# Patient Record
Sex: Female | Born: 1995 | Hispanic: No | Marital: Single | Smoking: Never smoker
Health system: Southern US, Community
[De-identification: ages and names within clinical notes are randomized; demographics above are authoritative.]

## PROBLEM LIST (undated history)

## (undated) DIAGNOSIS — R569 Unspecified convulsions: Principal | ICD-10-CM

---

## 2022-04-26 ENCOUNTER — Emergency Department (HOSPITAL_BASED_OUTPATIENT_CLINIC_OR_DEPARTMENT_OTHER): Payer: Self-pay | Admitting: Radiology

## 2022-04-26 ENCOUNTER — Emergency Department (HOSPITAL_BASED_OUTPATIENT_CLINIC_OR_DEPARTMENT_OTHER): Payer: Self-pay

## 2022-04-26 ENCOUNTER — Other Ambulatory Visit: Payer: Self-pay

## 2022-04-26 ENCOUNTER — Inpatient Hospital Stay (HOSPITAL_BASED_OUTPATIENT_CLINIC_OR_DEPARTMENT_OTHER)
Admission: EM | Admit: 2022-04-26 | Discharge: 2022-04-28 | DRG: 101 | Disposition: A | Discharge status: Home / Self Care | Payer: Self-pay | Attending: Family Medicine | Admitting: Family Medicine

## 2022-04-26 ENCOUNTER — Encounter (HOSPITAL_BASED_OUTPATIENT_CLINIC_OR_DEPARTMENT_OTHER): Payer: Self-pay

## 2022-04-26 DIAGNOSIS — D75839 Thrombocytosis, unspecified: Secondary | ICD-10-CM | POA: Diagnosis present

## 2022-04-26 DIAGNOSIS — Z560 Unemployment, unspecified: Secondary | ICD-10-CM

## 2022-04-26 DIAGNOSIS — R5383 Other fatigue: Secondary | ICD-10-CM | POA: Diagnosis present

## 2022-04-26 DIAGNOSIS — Z609 Problem related to social environment, unspecified: Secondary | ICD-10-CM

## 2022-04-26 DIAGNOSIS — R569 Unspecified convulsions: Principal | ICD-10-CM

## 2022-04-26 DIAGNOSIS — M791 Myalgia, unspecified site: Secondary | ICD-10-CM | POA: Diagnosis present

## 2022-04-26 DIAGNOSIS — R110 Nausea: Secondary | ICD-10-CM | POA: Diagnosis present

## 2022-04-26 DIAGNOSIS — Z79899 Other long term (current) drug therapy: Secondary | ICD-10-CM

## 2022-04-26 HISTORY — DX: Unspecified convulsions: R56.9

## 2022-04-26 LAB — URINALYSIS, ROUTINE W REFLEX MICROSCOPIC
Bilirubin Urine: NEGATIVE
Glucose, UA: NEGATIVE mg/dL
Hgb urine dipstick: NEGATIVE
Ketones, ur: NEGATIVE mg/dL
Leukocytes,Ua: NEGATIVE
Nitrite: NEGATIVE
Protein, ur: NEGATIVE mg/dL
Specific Gravity, Urine: 1.011 (ref 1.005–1.030)
pH: 6.5 (ref 5.0–8.0)

## 2022-04-26 LAB — CBC WITH DIFFERENTIAL/PLATELET
Abs Immature Granulocytes: 0.01 10*3/uL (ref 0.00–0.07)
Basophils Absolute: 0 10*3/uL (ref 0.0–0.1)
Basophils Relative: 0 %
Eosinophils Absolute: 0.2 10*3/uL (ref 0.0–0.5)
Eosinophils Relative: 2 %
HCT: 36.9 % (ref 36.0–46.0)
Hemoglobin: 12 g/dL (ref 12.0–15.0)
Immature Granulocytes: 0 %
Lymphocytes Relative: 34 %
Lymphs Abs: 2.9 10*3/uL (ref 0.7–4.0)
MCH: 27.8 pg (ref 26.0–34.0)
MCHC: 32.5 g/dL (ref 30.0–36.0)
MCV: 85.6 fL (ref 80.0–100.0)
Monocytes Absolute: 0.7 10*3/uL (ref 0.1–1.0)
Monocytes Relative: 8 %
Neutro Abs: 4.7 10*3/uL (ref 1.7–7.7)
Neutrophils Relative %: 56 %
Platelets: 422 10*3/uL — ABNORMAL HIGH (ref 150–400)
RBC: 4.31 MIL/uL (ref 3.87–5.11)
RDW: 13.5 % (ref 11.5–15.5)
WBC: 8.4 10*3/uL (ref 4.0–10.5)
nRBC: 0 % (ref 0.0–0.2)

## 2022-04-26 LAB — CBG MONITORING, ED: Glucose-Capillary: 84 mg/dL (ref 70–99)

## 2022-04-26 LAB — COMPREHENSIVE METABOLIC PANEL WITH GFR
ALT: 14 U/L (ref 0–44)
AST: 18 U/L (ref 15–41)
Albumin: 3.8 g/dL (ref 3.5–5.0)
Alkaline Phosphatase: 58 U/L (ref 38–126)
Anion gap: 7 (ref 5–15)
BUN: 7 mg/dL (ref 6–20)
CO2: 30 mmol/L (ref 22–32)
Calcium: 9.1 mg/dL (ref 8.9–10.3)
Chloride: 104 mmol/L (ref 98–111)
Creatinine, Ser: 0.71 mg/dL (ref 0.44–1.00)
GFR, Estimated: >60 mL/min
Glucose, Bld: 91 mg/dL (ref 70–99)
Potassium: 3.8 mmol/L (ref 3.5–5.1)
Sodium: 141 mmol/L (ref 135–145)
Total Bilirubin: 0.2 mg/dL — ABNORMAL LOW (ref 0.3–1.2)
Total Protein: 6.6 g/dL (ref 6.5–8.1)

## 2022-04-26 LAB — RAPID URINE DRUG SCREEN, HOSP PERFORMED
Amphetamines: NOT DETECTED
Barbiturates: NOT DETECTED
Benzodiazepines: NOT DETECTED
Cocaine: NOT DETECTED
Opiates: NOT DETECTED
Tetrahydrocannabinol: NOT DETECTED

## 2022-04-26 LAB — HCG, SERUM, QUALITATIVE: Preg, Serum: NEGATIVE

## 2022-04-26 IMAGING — CT CT HEAD W/O CM
4 series · 16 of 47 positions shown, 18 images · non-contrast
Comparison: None Available.

CLINICAL DATA: Seizure.



[Series 2: head wo · axial · 0.44mm/px · z∈[-466,-346]mm · 7 of 34 slices shown, 9 images]
[im 5/34  brain]
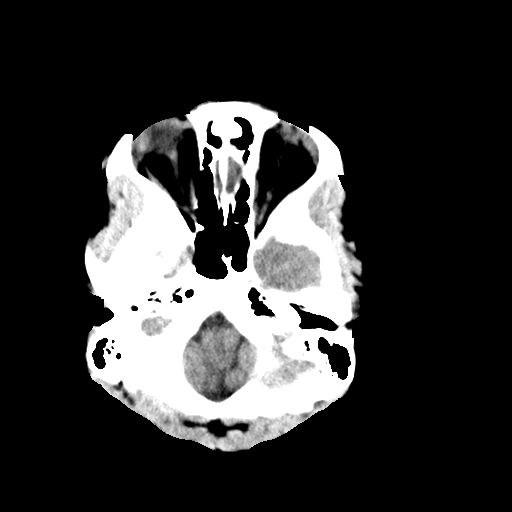
[im 5/34  bone]
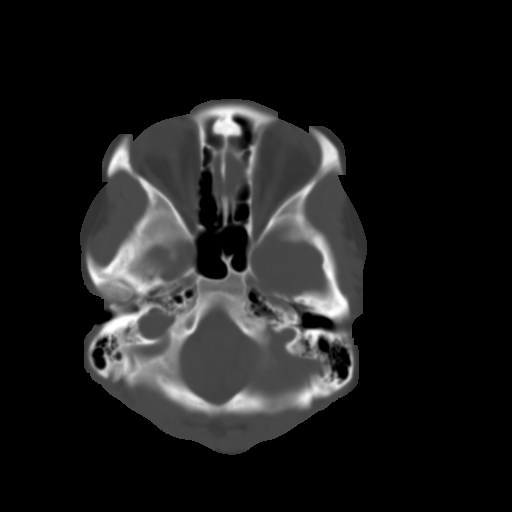
[im 9/34  brain]
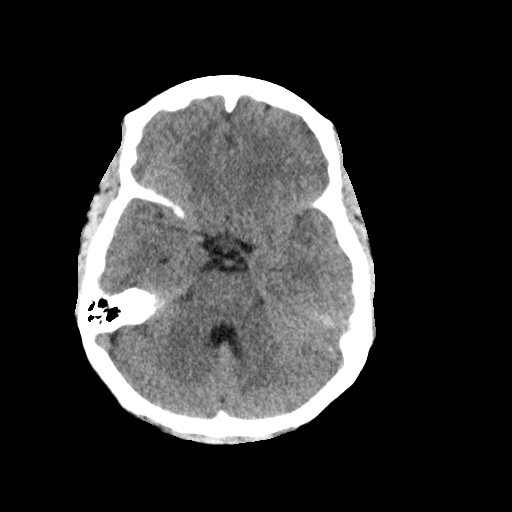
[im 13/34  brain]
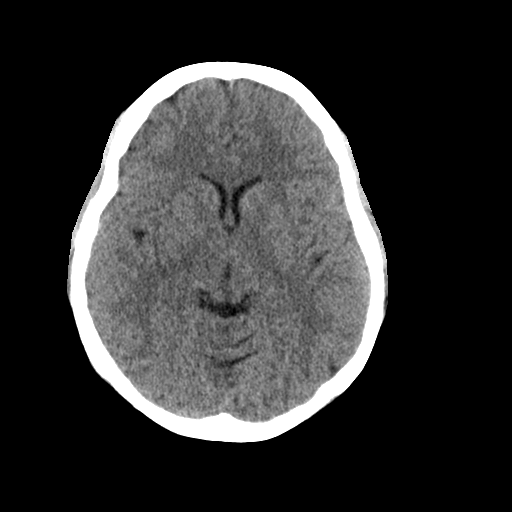
[im 17/34  brain]
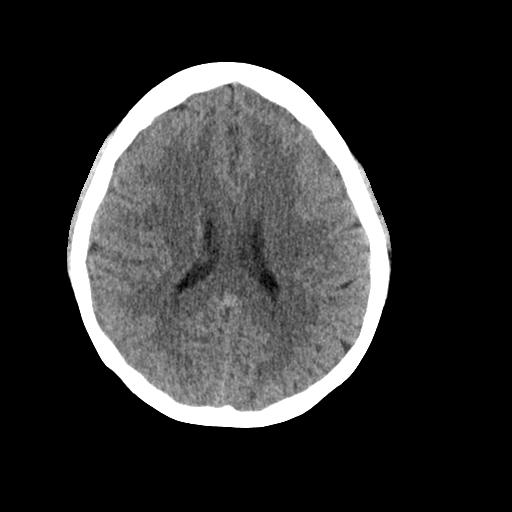
[im 21/34  brain]
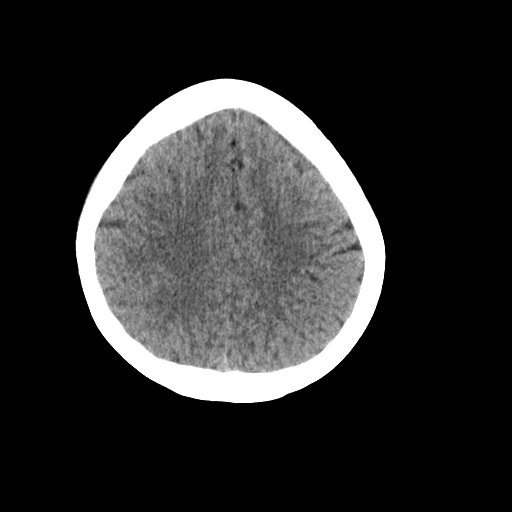
[im 21/34  bone]
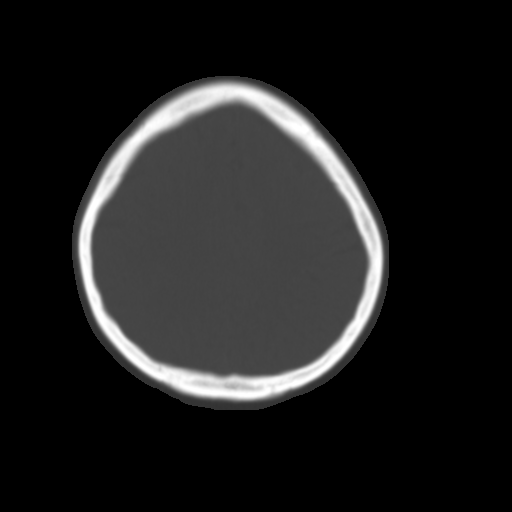
[im 25/34  brain]
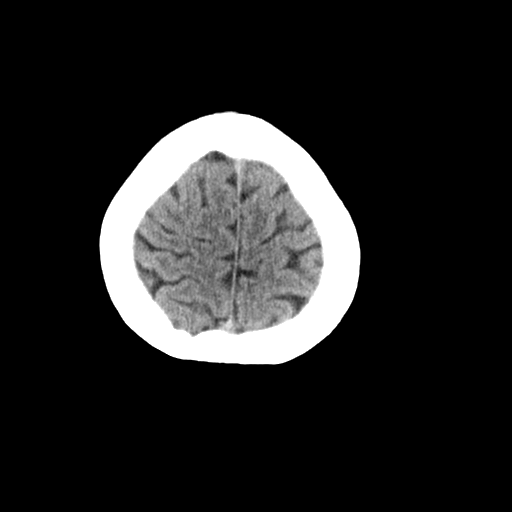
[im 29/34  brain]
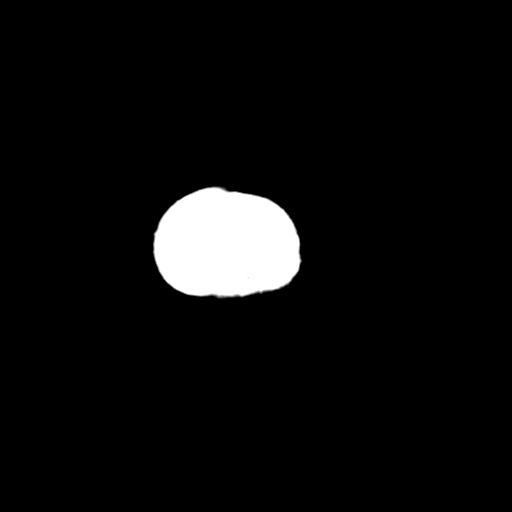

[Series 3: head bone · axial · 0.44mm/px · z∈[-470,-438]mm · 3 of 84 slices shown]
[im 9/84  bone]
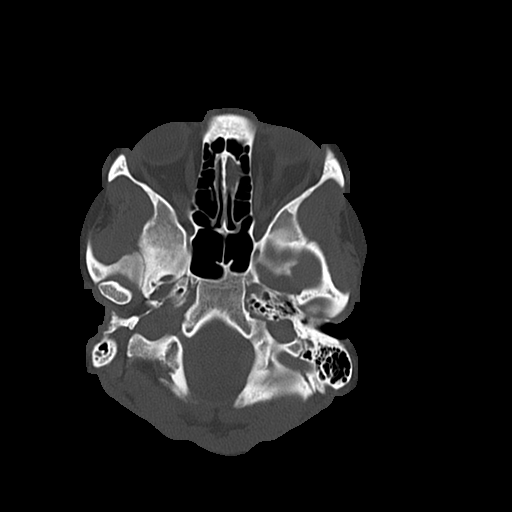
[im 17/84  bone]
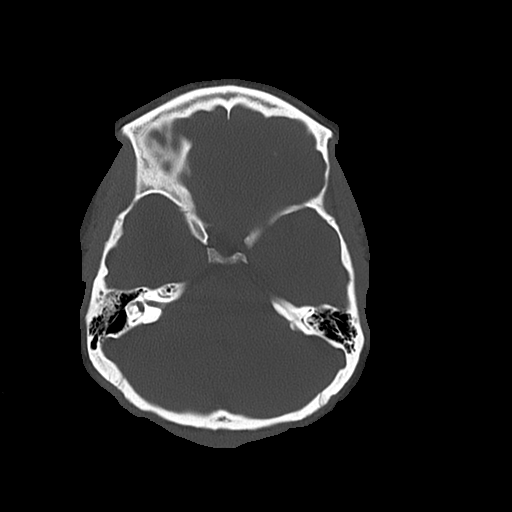
[im 25/84  bone]
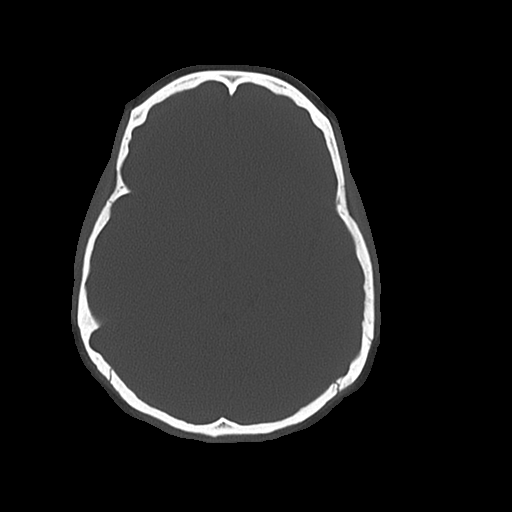

[Series 4: coronal soft · coronal · 0.31mm/px · 3 of 72 slices shown]
[im 24/72  brain]
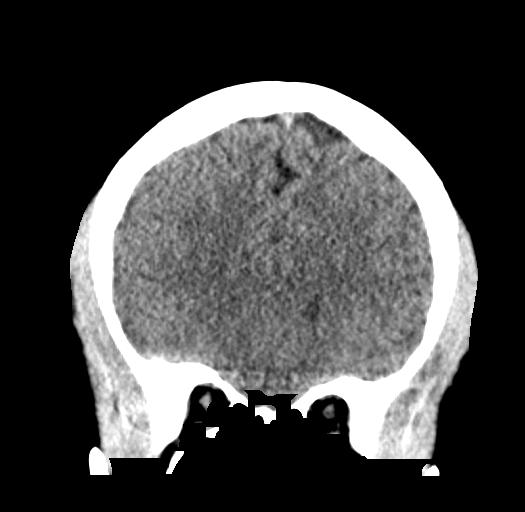
[im 32/72  brain]
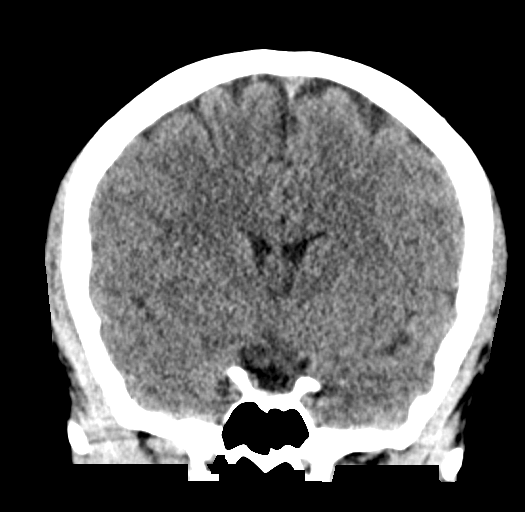
[im 40/72  brain]
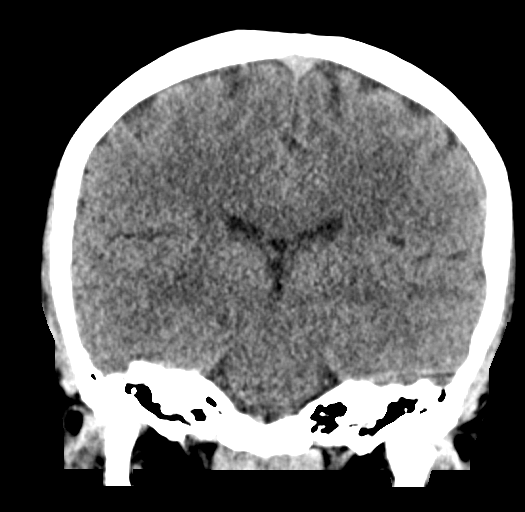

[Series 5: sagittal soft · sagittal · 0.31mm/px · 3 of 54 slices shown]
[im 18/54  brain]
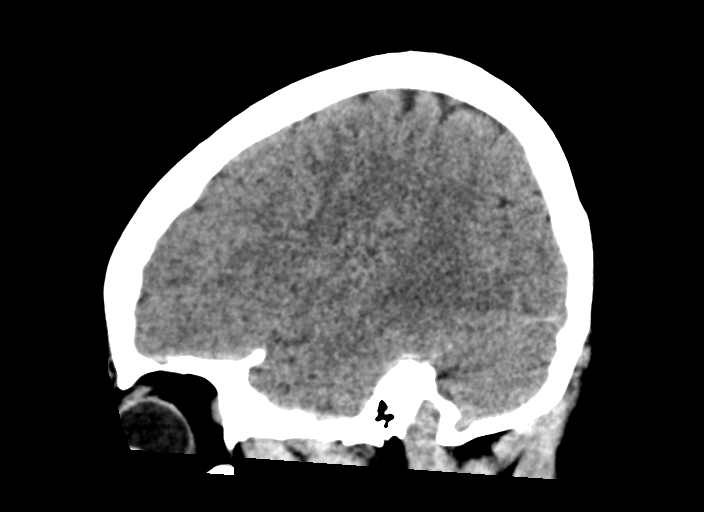
[im 27/54  brain]
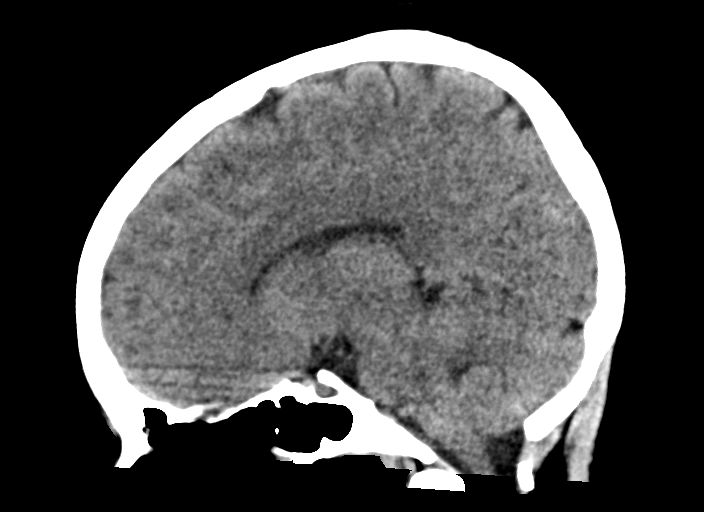
[im 36/54  brain]
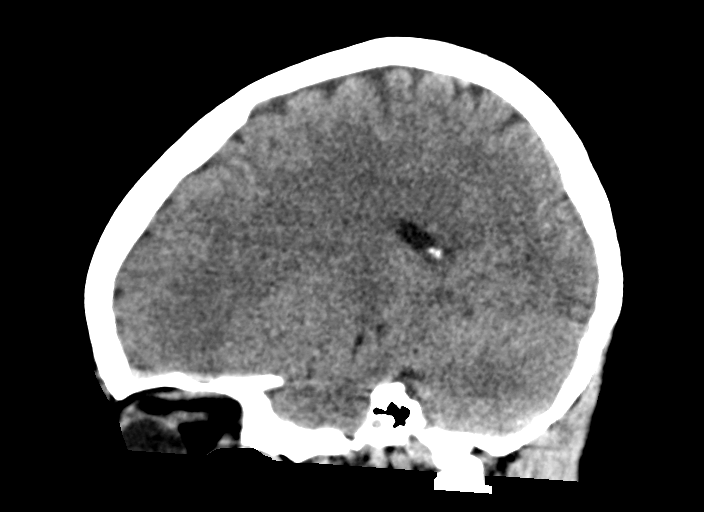

[16 of 47 positions shown; findings below may reference images not displayed]

FINDINGS: Brain: The ventricles and sulci are appropriate size for the
patient's age. The gray-white matter discrimination is preserved.
There is no acute intracranial hemorrhage. No mass effect or midline
shift. No extra-axial fluid collection.

Vascular: No hyperdense vessel or unexpected calcification.

Skull: Normal. Negative for fracture or focal lesion.

Sinuses/Orbits: No acute finding.

Other: None
IMPRESSION: Unremarkable noncontrast CT of the brain.

## 2022-04-26 IMAGING — DX DG CHEST 2V
2 series · 2 of 2 positions shown · non-contrast
Comparison: None Available.

CLINICAL DATA: Multiple seizures.

EXAM:
CHEST - 2 VIEW

[chest lat]
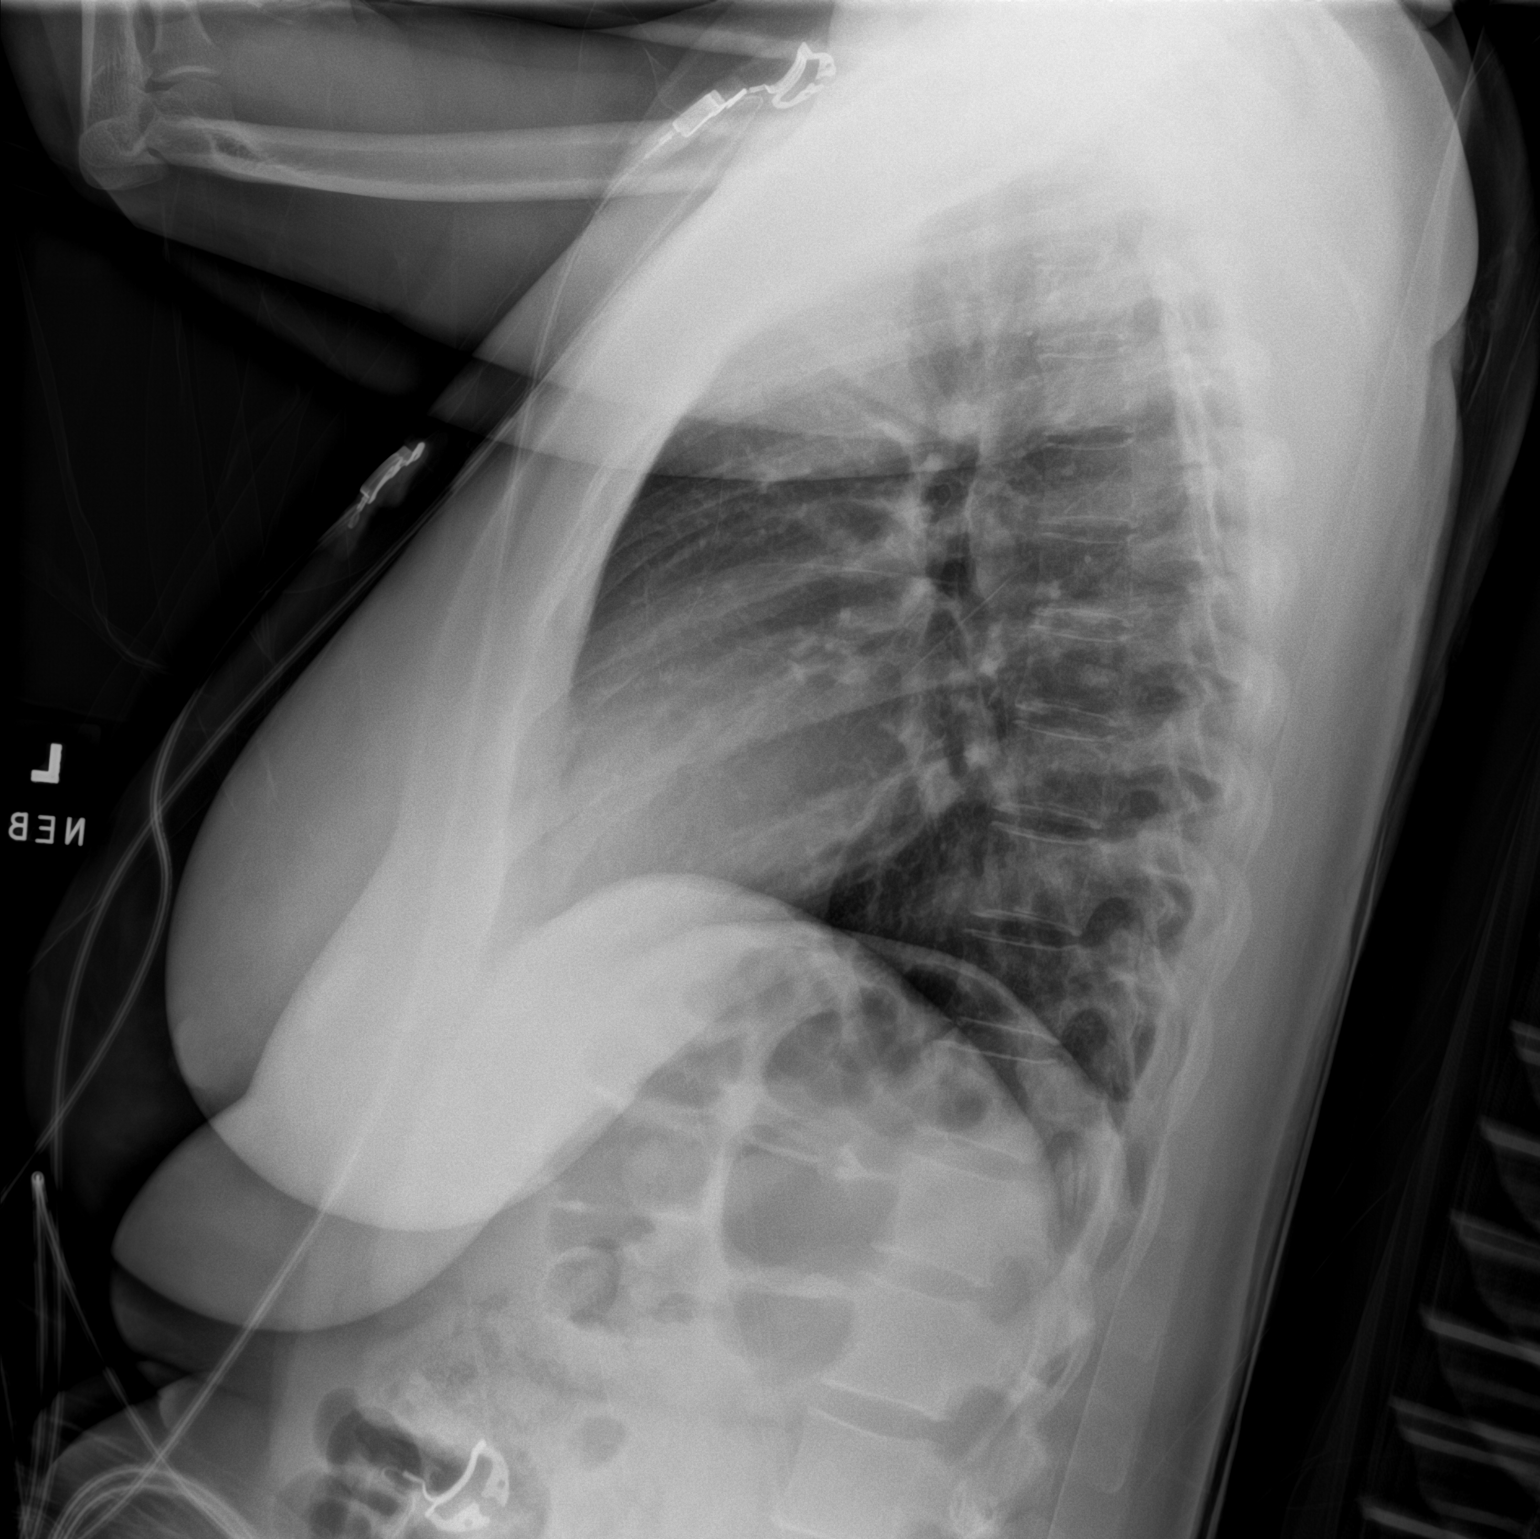

[chest ap]
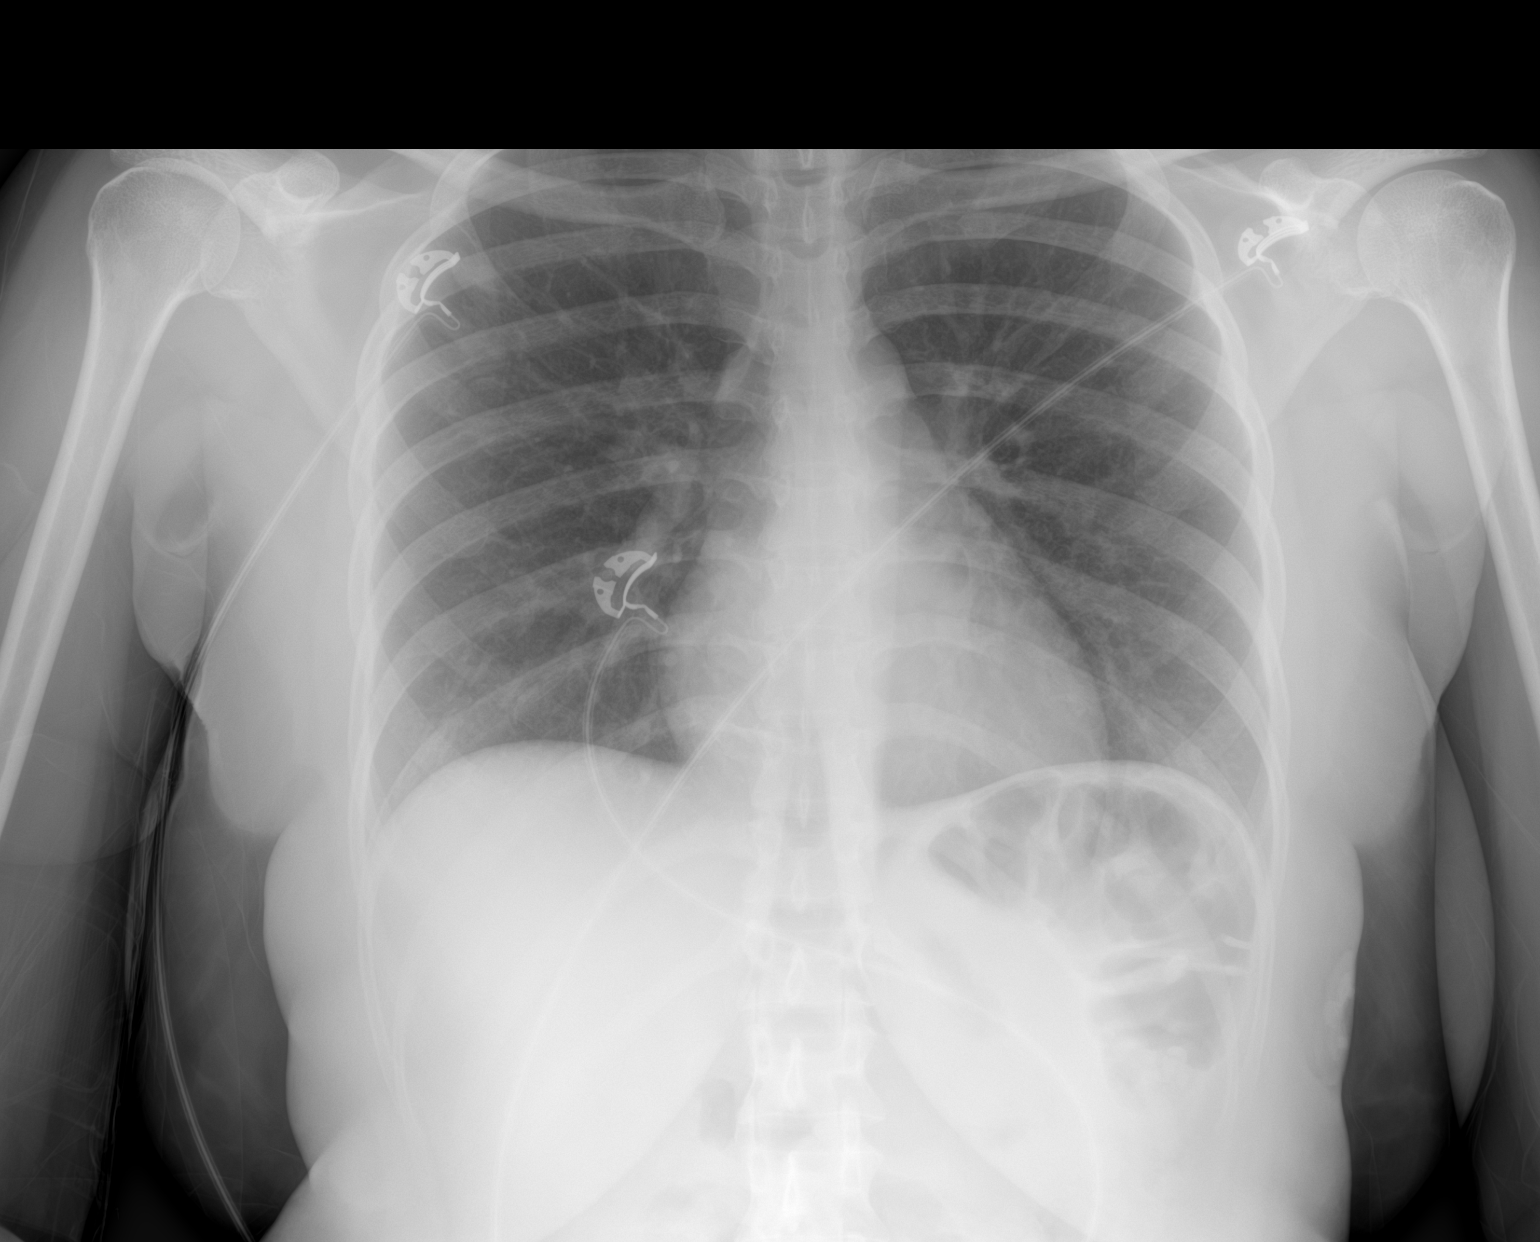

[2 of 2 positions shown; findings below may reference images not displayed]

FINDINGS: The cardiomediastinal contours are normal. The lungs are clear.
Pulmonary vasculature is normal. No consolidation, pleural effusion,
or pneumothorax. No acute osseous abnormalities are seen.
IMPRESSION: Negative radiographs of the chest.

## 2022-04-26 MED ORDER — LEVETIRACETAM 500 MG PO TABS
500.0000 mg | ORAL_TABLET | Freq: Two times a day (BID) | ORAL | Status: DC
  Administered 2022-04-27 – 2022-04-28 (×3): 500 mg via ORAL
  Filled 2022-04-26 (×3): qty 1

## 2022-04-26 MED ORDER — LEVETIRACETAM IN NACL 500 MG/100ML IV SOLN
1000.0000 mg | Freq: Once | INTRAVENOUS | Status: AC
  Administered 2022-04-26: 1000 mg via INTRAVENOUS
  Filled 2022-04-26: qty 200

## 2022-04-26 MED ORDER — LEVETIRACETAM IN NACL 1000 MG/100ML IV SOLN
1000.0000 mg | Freq: Once | INTRAVENOUS | Status: AC
  Administered 2022-04-26: 500 mg via INTRAVENOUS
  Filled 2022-04-26: qty 100

## 2022-04-26 MED ORDER — LEVETIRACETAM IN NACL 1500 MG/100ML IV SOLN
1500.0000 mg | Freq: Once | INTRAVENOUS | Status: DC
  Filled 2022-04-26: qty 100

## 2022-04-26 NOTE — ED Notes (Addendum)
BG level 84 ?

## 2022-04-26 NOTE — ED Notes (Signed)
Provider cleared pt for liquid diet // food provided for pt ?

## 2022-04-26 NOTE — ED Provider Notes (Signed)
?[facility]PT ?Provider Note ? ? ?CSN: [id number] ?Arrival date & time: 04/26/22  1851 ? ?  ? ?History ? ?Chief Complaint  ?Patient presents with  ? Seizures  ? ? ?Elizabeth Joseph is a 26 y.o. female who presents today for evaluation of multiple seizures.  History is primarily provided by her sister. ?Patient refused a professional medical interpreter when I attempted to provide 1 instead requesting her sister translate. ?Patient does not have a history of seizures.  In the past 3 days she has had 5 seizures.  She has not had any medications or medication changes.  She feels very tired after she wakes up.   ? ?Her sister shows me a video of one of her events that happened earlier today.  Patient was watching TV and began having what appeared consistent with a tonic-clonic seizure that lasted about 2 minutes on video.  Prior to the onset of this patient appeared to be possibly having a absence type seizure that then turned into clonic movements of all 4 extremities.  During this time patient was not responding.   ? ?She was confused after according to her sister.  She did urinate on herself during 1 of these episodes. ? ?Patient does not endorse any regular alcohol use or drug use.  She has been nauseated.  She reports that globally she feels very drained and fatigued.  She does not have family history of epilepsy ? ?HPI ? ?  ? ?Home Medications ?Prior to Admission medications   ?Not on File  ?   ? ?Allergies    ?Patient has no known allergies.   ? ?Review of Systems   ?Review of Systems ?See above ? ?Physical Exam ?Updated Vital Signs ?BP 115/69   Pulse 81   Temp 98.2 ?F (36.8 ?C)   Resp 17   Ht 5\' 1"  (1.549 m)   Wt 72.6 kg   SpO2 100%   BMI 30.23 kg/m?  ?Physical Exam ?Vitals and nursing note reviewed.  ?Constitutional:   ?   General: She is not in acute distress. ?   Appearance: She is not ill-appearing.  ?HENT:  ?   Head: Normocephalic and atraumatic.  ?Eyes:  ?   Extraocular  Movements: Extraocular movements intact.  ?   Conjunctiva/sclera: Conjunctivae normal.  ?   Pupils: Pupils are equal, round, and reactive to light.  ?Cardiovascular:  ?   Rate and Rhythm: Normal rate and regular rhythm.  ?   Pulses: Normal pulses.  ?Pulmonary:  ?   Effort: Pulmonary effort is normal. No respiratory distress.  ?Abdominal:  ?   General: There is no distension.  ?   Tenderness: There is no abdominal tenderness.  ?Musculoskeletal:  ?   Cervical back: Normal range of motion and neck supple.  ?   Right lower leg: No edema.  ?   Left lower leg: No edema.  ?   Comments: No obvious acute injury  ?Skin: ?   General: Skin is warm.  ?Neurological:  ?   General: No focal deficit present.  ?   Mental Status: She is alert.  ?   Cranial Nerves: No cranial nerve deficit.  ?   Sensory: No sensory deficit.  ?   Motor: No weakness.  ?   Comments: Patient is awake and alert.  Speech is not slurred.  She answers sisters questions appropriately without difficulty. ?Patient has 5/5 grip strength bilaterally.  No pronator drift.  5/5 strength in bilateral lower extremities.  Facial movements are symmetric.  ?Psychiatric:     ?   Mood and Affect: Mood normal.     ?   Behavior: Behavior normal.  ? ? ?ED Results / Procedures / Treatments   ?Labs ?(all labs ordered are listed, but only abnormal results are displayed) ?Labs Reviewed  ?URINALYSIS, ROUTINE W REFLEX MICROSCOPIC - Abnormal; Notable for the following components:  ?    Result Value  ? APPearance HAZY (*)   ? Bacteria, UA RARE (*)   ? All other components within normal limits  ?COMPREHENSIVE METABOLIC PANEL - Abnormal; Notable for the following components:  ? Total Bilirubin 0.2 (*)   ? All other components within normal limits  ?CBC WITH DIFFERENTIAL/PLATELET - Abnormal; Notable for the following components:  ? Platelets 422 (*)   ? All other components within normal limits  ?HCG, SERUM, QUALITATIVE  ?RAPID URINE DRUG SCREEN, HOSP PERFORMED  ?MAGNESIUM  ?CBG  MONITORING, ED  ? ? ?EKG ?EKG Interpretation ? ?Date/Time:  Tuesday Apr 26 2022 19:21:10 EDT ?Ventricular Rate:  88 ?PR Interval:  156 ?QRS Duration: 84 ?QT Interval:  360 ?QTC Calculation: 435 ?R Axis:   55 ?Text Interpretation: Normal sinus rhythm Normal ECG No previous ECGs available Confirmed by Gwyneth SproutPlunkett, Whitney ([id number]) on 04/26/2022 10:48:07 PM ? ?Radiology ?DG Chest 2 View ? ?Result Date: 04/26/2022 ?CLINICAL DATA:  Multiple seizures. EXAM: CHEST - 2 VIEW COMPARISON:  None Available. FINDINGS: The cardiomediastinal contours are normal. The lungs are clear. Pulmonary vasculature is normal. No consolidation, pleural effusion, or pneumothorax. No acute osseous abnormalities are seen. IMPRESSION: Negative radiographs of the chest. Electronically Signed   By: Narda RutherfordMelanie  Sanford M.D.   On: 04/26/2022 21:53  ? ?CT Head Wo Contrast ? ?Result Date: 04/26/2022 ?CLINICAL DATA:  Seizure. EXAM: CT HEAD WITHOUT CONTRAST TECHNIQUE: Contiguous axial images were obtained from the base of the skull through the vertex without intravenous contrast. RADIATION DOSE REDUCTION: This exam was performed according to the departmental dose-optimization program which includes automated exposure control, adjustment of the mA and/or kV according to patient size and/or use of iterative reconstruction technique. COMPARISON:  None Available. FINDINGS: Brain: The ventricles and sulci are appropriate size for the patient's age. The gray-white matter discrimination is preserved. There is no acute intracranial hemorrhage. No mass effect or midline shift. No extra-axial fluid collection. Vascular: No hyperdense vessel or unexpected calcification. Skull: Normal. Negative for fracture or focal lesion. Sinuses/Orbits: No acute finding. Other: None IMPRESSION: Unremarkable noncontrast CT of the brain. Electronically Signed   By: Elgie CollardArash  Radparvar M.D.   On: 04/26/2022 21:43   ? ?Procedures ?Procedures  ? ? ?Medications Ordered in ED ?Medications   ?levETIRAcetam (KEPPRA) tablet 500 mg (has no administration in time range)  ?levETIRAcetam (KEPPRA) IVPB 500 mg/100 mL premix (0 mg Intravenous Stopped 04/26/22 2310)  ?  And  ?levETIRAcetam (KEPPRA) IVPB 1000 mg/100 mL premix (0 mg Intravenous Stopped 04/26/22 2300)  ? ? ?ED Course/ Medical Decision Making/ A&P ?Clinical Course as of 04/27/22 0008  ?Tue Apr 26, 2022  ?2218 I spoke with Dr. Rutha BouchardSal K of neurology, recommends admission, MRI, EEG.  Keppra load IV and initiation of 500 mg of Keppra twice daily. [EH]  ?2225 [address] [place]. ?Sister is Sata Hunnicutt [phone] [EH]  ?  ?Clinical Course User Index ?[EH] Cristina GongHammond, Payeton Germani W, PA-C  ? ?                        ?Medical  Decision Making ?Patient is a 26 year old woman who presents today for evaluation of new onset seizures. ?She has had 5 seizures over the past 3 days and prior to this did not have any seizure history. ?She does not endorse any drug use, does not take any medications, does not drink alcohol. ?Patient's sister was able to capture one of her seizures on video today and showed me footage that appeared consistent with a tonic-clonic seizure. ?Labs are obtained and reviewed, please see below. ?CT head is obtained without mass, intracranial hemorrhage or other acute abnormalities.  Chest x-ray is obtained as patient has had multiple seizures to evaluate for possibility of aspiration without abnormality. ? ?It is unclear why patient has had a cluster of some any seizures without a significant history of seizures or other similar activity. ?I spoke with neurology who agrees that patient requires admission.  Recommends MRI, EEG, in addition to Keppra loading patient with 1500 mg IV Keppra to bring her into a therapeutic range of 20/kg in addition to initiation of p.o. Keppra twice daily. ? ?I do not think that patient would be suitable for outpatient evaluation of this given the amount of seizure she has had in a short.  With time, addition to her language  barrier and lack of established primary care. ? ?I did advise patient that she cannot drive for the next 6 months. ? ?I spoke with hospitalist who will admit patient. ? ?Amount and/or Complexity of Data Reviewed ?Independent Historian:  ?   Deta

## 2022-04-26 NOTE — ED Notes (Signed)
Patient transported to CT 

## 2022-04-26 NOTE — ED Notes (Signed)
Lab aware of magnesium add on  ?

## 2022-04-26 NOTE — ED Triage Notes (Signed)
Patient here POV from Home. ? ?Endorses having Seizures for approximately 3 days. Total of 4 Seizures (2 Today). No History of Seizures. Last Episode was at 1400 today. ? ?NAD Noted during Triage. A&Ox4. GCS 15. BIB Wheelchair. ?

## 2022-04-27 ENCOUNTER — Inpatient Hospital Stay (HOSPITAL_COMMUNITY): Payer: Self-pay

## 2022-04-27 ENCOUNTER — Encounter (HOSPITAL_COMMUNITY): Payer: Self-pay | Admitting: Internal Medicine

## 2022-04-27 DIAGNOSIS — R569 Unspecified convulsions: Secondary | ICD-10-CM

## 2022-04-27 DIAGNOSIS — Z609 Problem related to social environment, unspecified: Secondary | ICD-10-CM | POA: Insufficient documentation

## 2022-04-27 LAB — CK: Total CK: 45 U/L (ref 38–234)

## 2022-04-27 LAB — MAGNESIUM: Magnesium: 2 mg/dL (ref 1.7–2.4)

## 2022-04-27 IMAGING — MR MR HEAD W/ CM
3 series · 41 of 48 positions shown · IV contrast (7 GAD)
Comparison: Brain MRI performed earlier today [DATE].

CLINICAL DATA: Provided history: Generalized seizure.

EXAM:
MRI HEAD WITH CONTRAST
TECHNIQUE: Multiplanar, multiecho pulse sequences of the brain and surrounding
structures were obtained with intravenous contrast.
CONTRAST:  7mL GADAVIST GADOBUTROL 1 MMOL/ML IV SOLN

[Series 2: ax 3(person_name) pre · axial · non-contrast · 3.0mm · 0.94mm/px · z∈[-115,+35]mm · 12 of 56 slices shown]
[im 1/56]
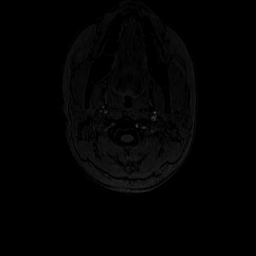
[im 4/56]
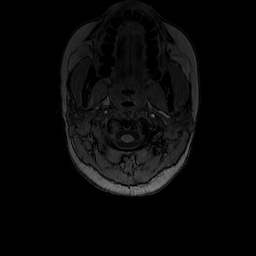
[im 7/56]
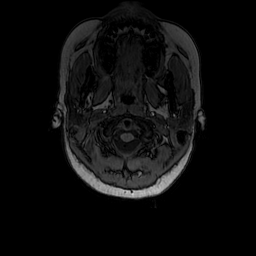
[im 10/56]
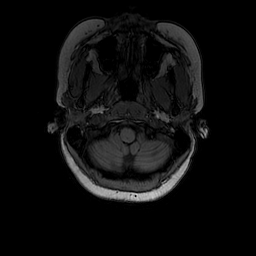
[im 13/56]
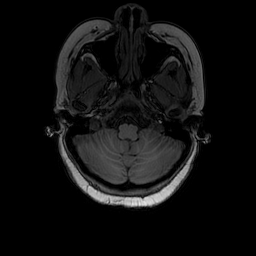
[im 16/56]
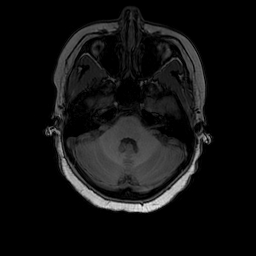
[im 25/56]
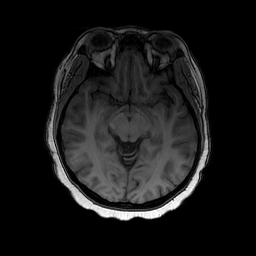
[im 28/56]
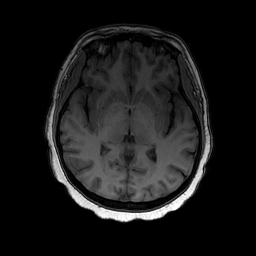
[im 31/56]
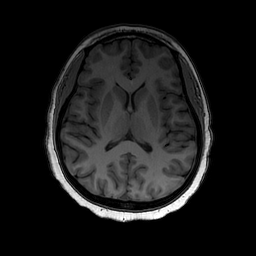
[im 40/56]
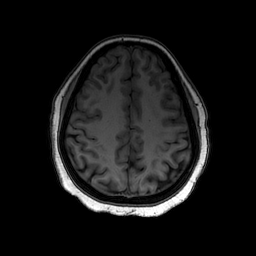
[im 46/56]
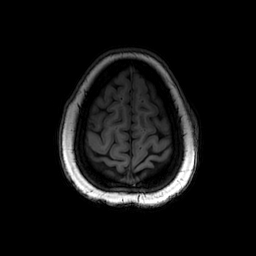
[im 52/56]
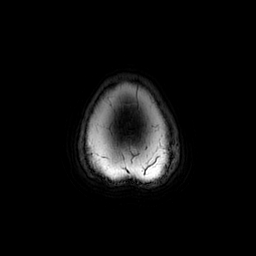

[Series 3: T1 · axial · 3.0mm · 0.94mm/px · z∈[-115,+47]mm · 19 of 56 slices shown (1 of 2)]
[im 1/56]
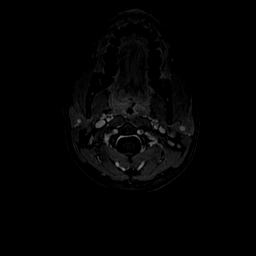
[im 4/56]
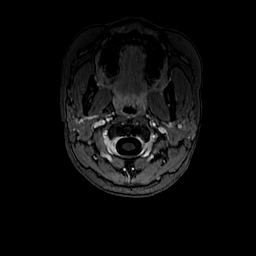
[im 7/56]
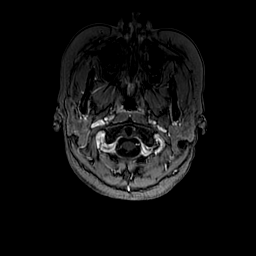
[im 10/56]
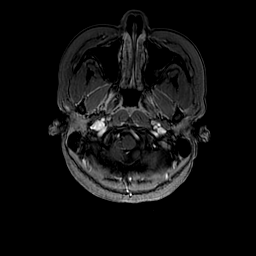
[im 13/56]
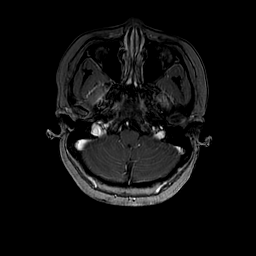
[im 16/56]
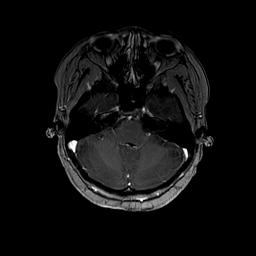
[im 19/56]
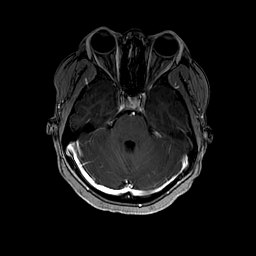
[im 22/56]
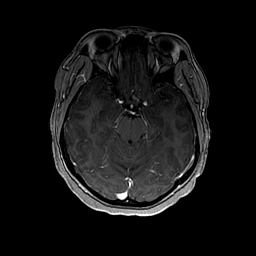
[im 25/56]
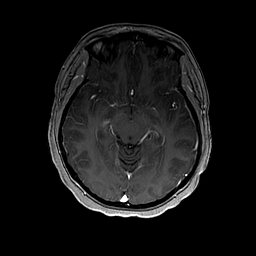
[im 28/56]
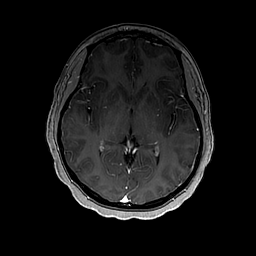
[im 31/56]
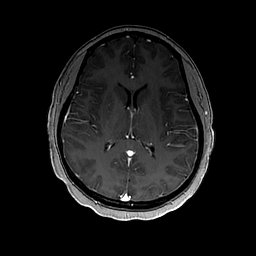
[im 34/56]
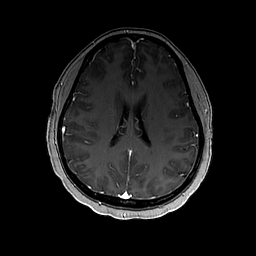
[im 37/56]
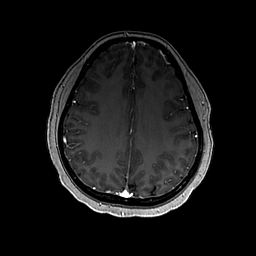
[im 40/56]
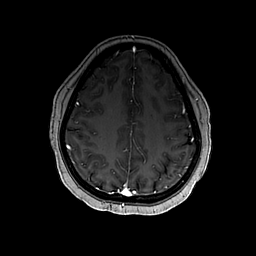
[im 43/56]
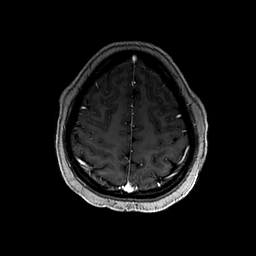
[im 46/56]
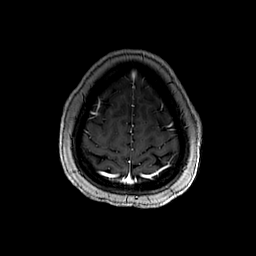
[im 49/56]
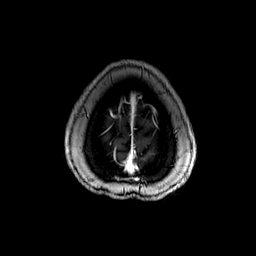
[im 52/56]
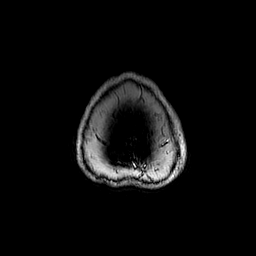
[im 56/56]
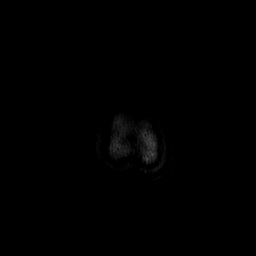

[Series 4: T1 · coronal · 5.0mm · 0.43mm/px · 10 of 30 slices shown (2 of 2)]
[im 1/30]
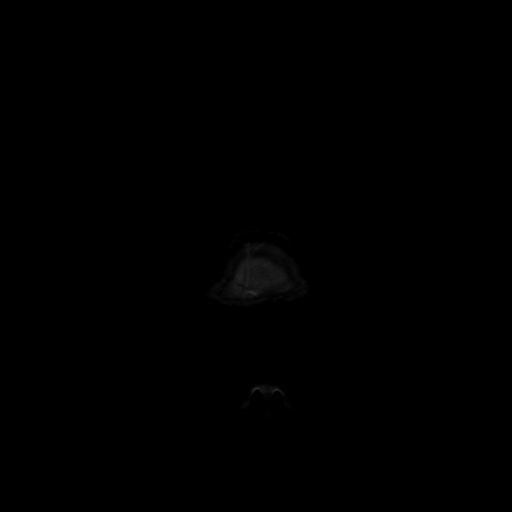
[im 4/30]
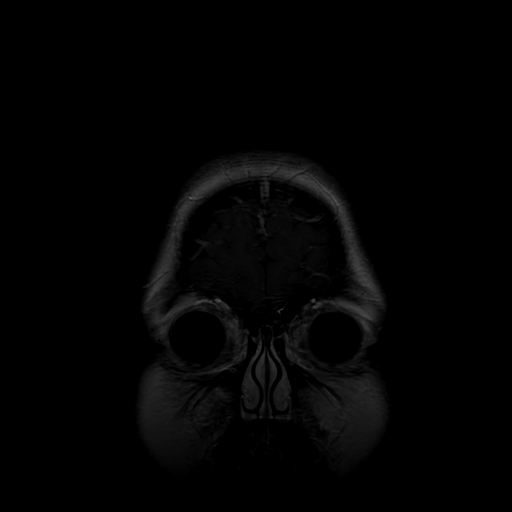
[im 7/30]
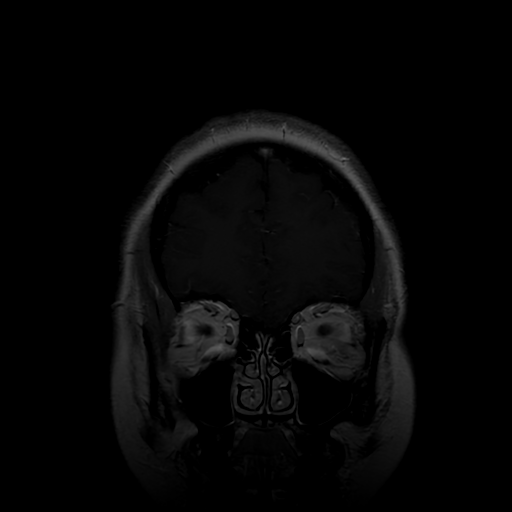
[im 10/30]
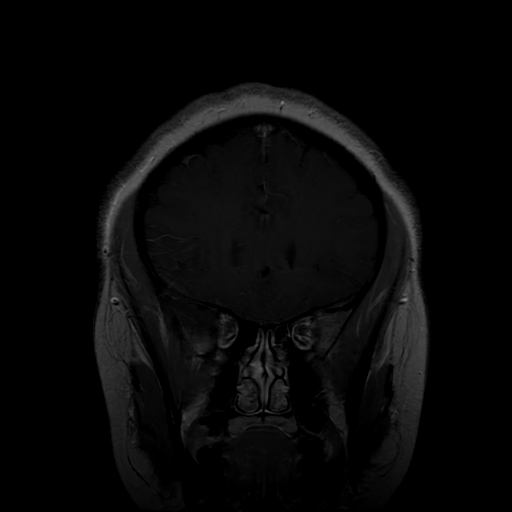
[im 13/30]
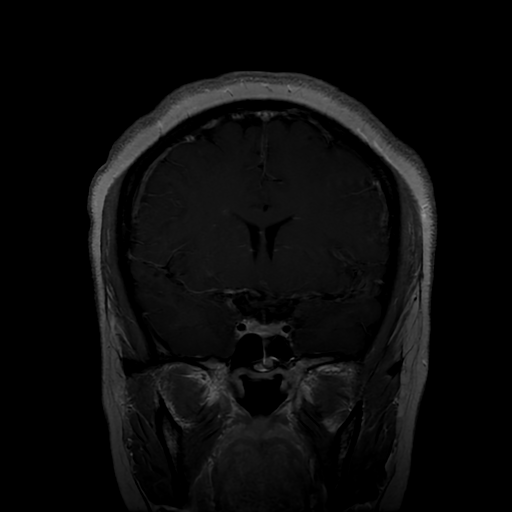
[im 17/30]
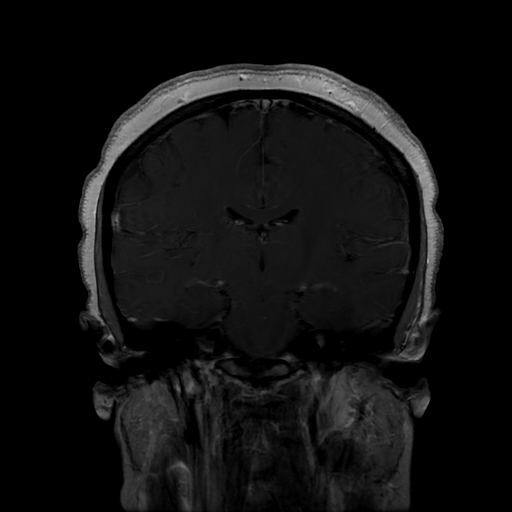
[im 20/30]
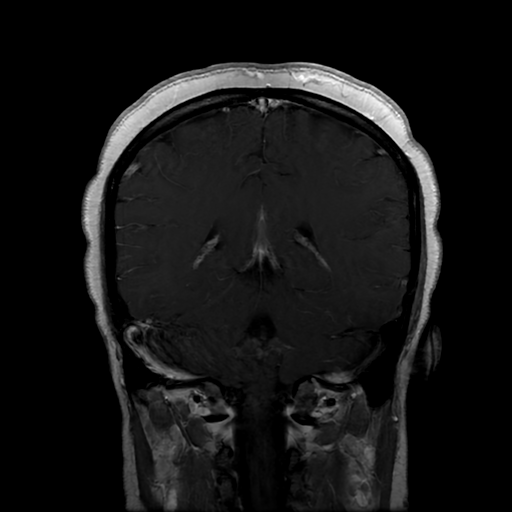
[im 23/30]
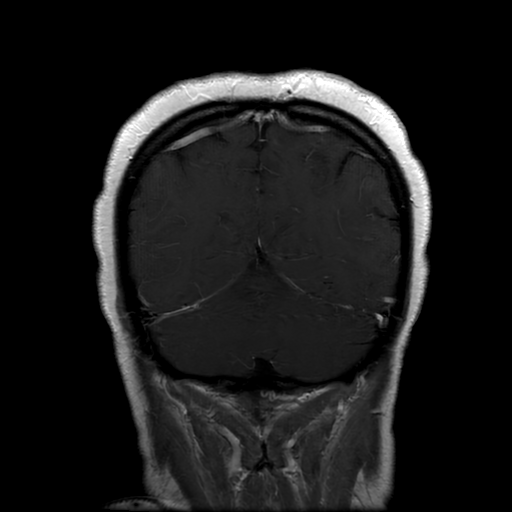
[im 26/30]
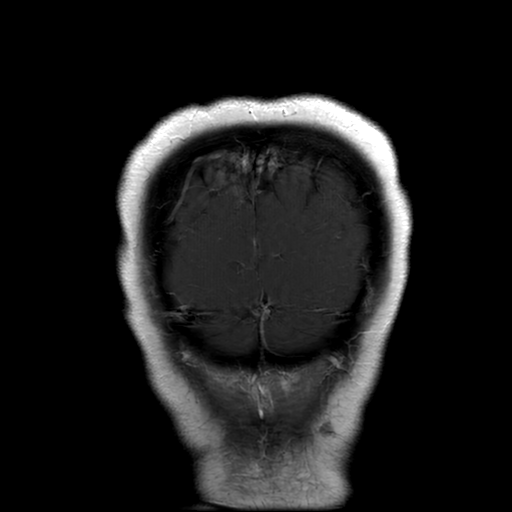
[im 30/30]
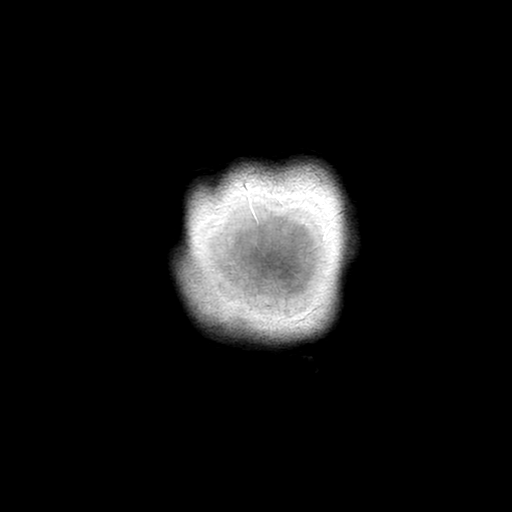

[41 of 48 positions shown; findings below may reference images not displayed]

FINDINGS: Brain:

A contrast-enhanced brain MRI was performed as a follow-up to the
non-contrast brain MRI performed earlier today. The following
sequences were acquired for the current study: Axial 3D T1 weighted
pre-contrast, axial T1 weighted post-contrast, coronal T1 weighted
post-contrast.

No pathologic intracranial enhancement is identified.

Vascular: Enhancement of the proximal large arterial vessels and
dural venous sinuses.

Skull and upper cervical spine: No focal suspicious marrow lesion.

Sinuses/Orbits: No mass or acute finding within the imaged orbits.
Trace mucosal thickening within the bilateral maxillary sinuses.
IMPRESSION: No pathologic intracranial enhancement is identified.

## 2022-04-27 IMAGING — MR MR HEAD W/O CM
10 of 12 series · 35 of 48 positions shown · non-contrast
Comparison: Head CT yesterday.

CLINICAL DATA: Seizure, new onset, no history of trauma.

EXAM:
MRI HEAD WITHOUT CONTRAST
TECHNIQUE: Multiplanar, multiecho pulse sequences of the brain and surrounding
structures were obtained without intravenous contrast.

[Series 3: DWI · axial · 3.0mm · 1.09mm/px · z∈[-157,-10]mm · 9 of 106 slices shown (1 of 4)]
[im 1/106]
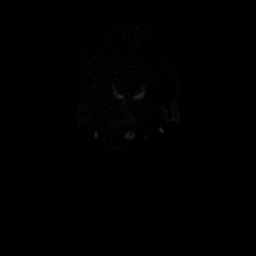
[im 14/106]
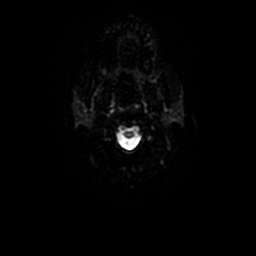
[im 27/106]
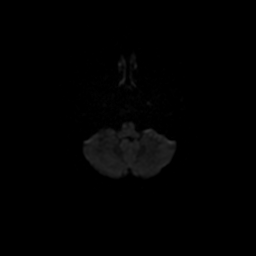
[im 40/106]
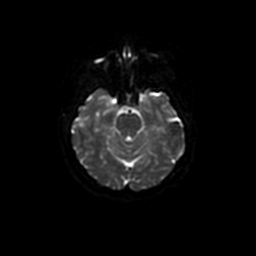
[im 53/106]
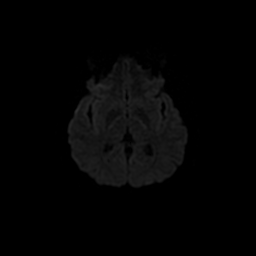
[im 66/106]
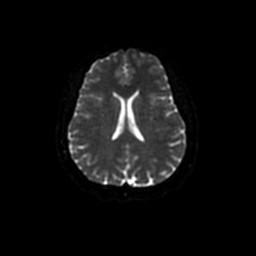
[im 79/106]
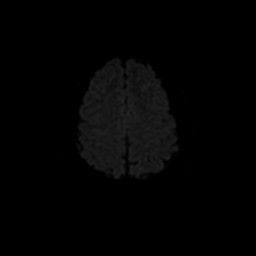
[im 92/106]
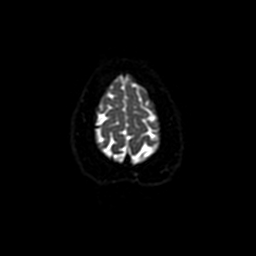
[im 106/106]
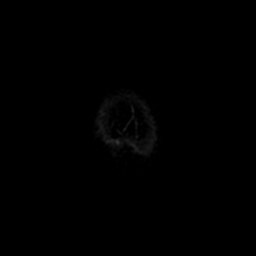

[Series 4: DWI · coronal · 5.0mm · 1.09mm/px · 6 of 68 slices shown (2 of 4)]
[im 1/68]
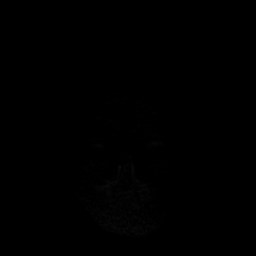
[im 14/68]
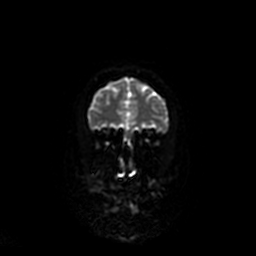
[im 27/68]
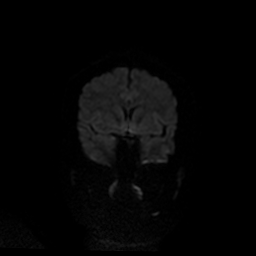
[im 41/68]
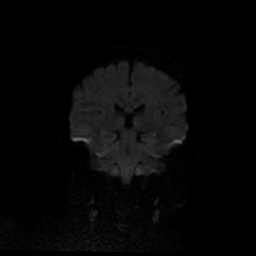
[im 54/68]
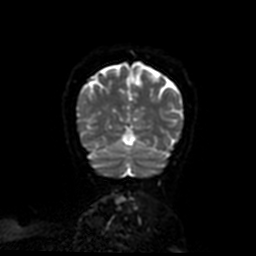
[im 68/68]
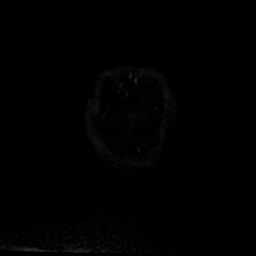

[Series 5: FLAIR · axial · 3.0mm · 0.43mm/px · z∈[-150,+3]mm · 2 of 28 slices shown (1 of 2)]
[im 1/28]
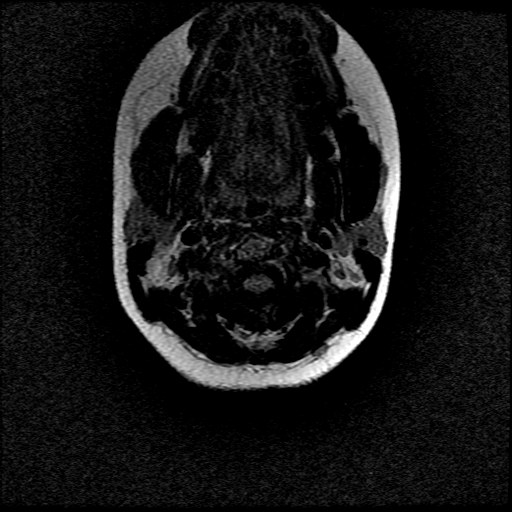
[im 28/28]
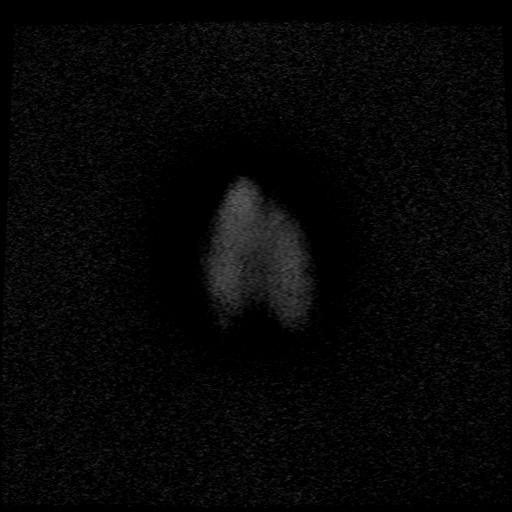

[Series 6: T1 · sagittal · 5.0mm · 0.47mm/px · 1 of 25 slices shown]
[im 1/25]
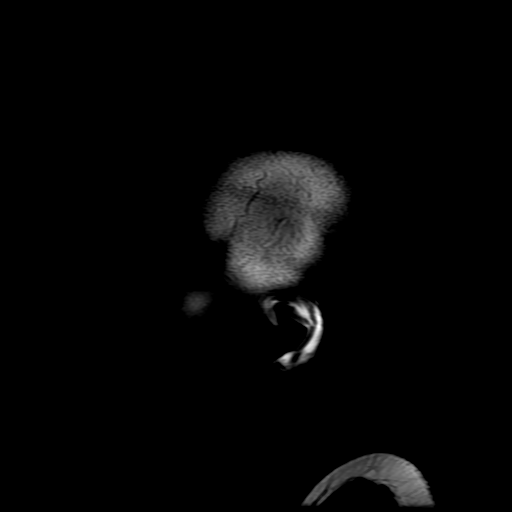

[Series 8: T2 · axial · 5.0mm · 0.43mm/px · z∈[-150,+3]mm · 2 of 28 slices shown (1 of 3)]
[im 1/28]
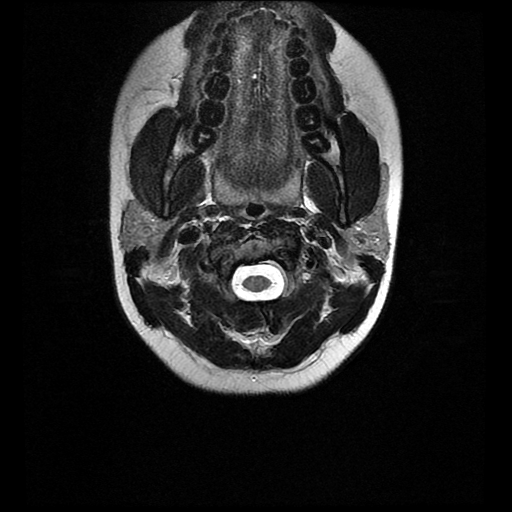
[im 28/28]
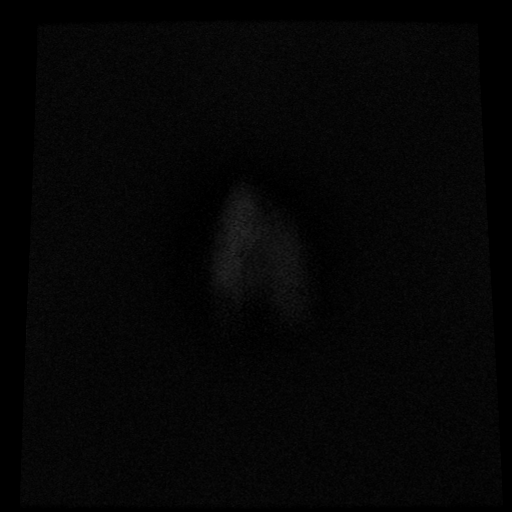

[Series 10: T2 · oblique · 3.0mm · 0.35mm/px · 2 of 26 slices shown (2 of 3)]
[im 1/26]
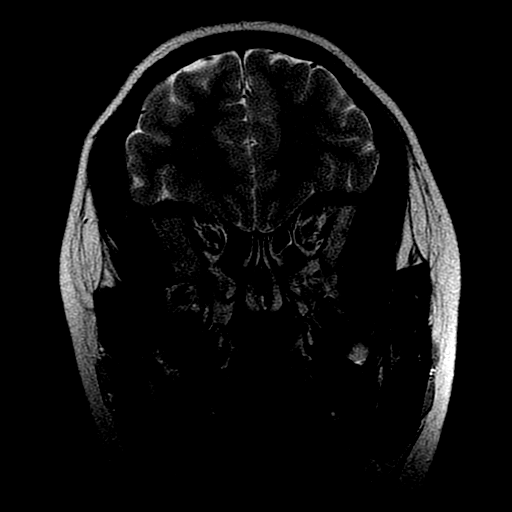
[im 26/26]
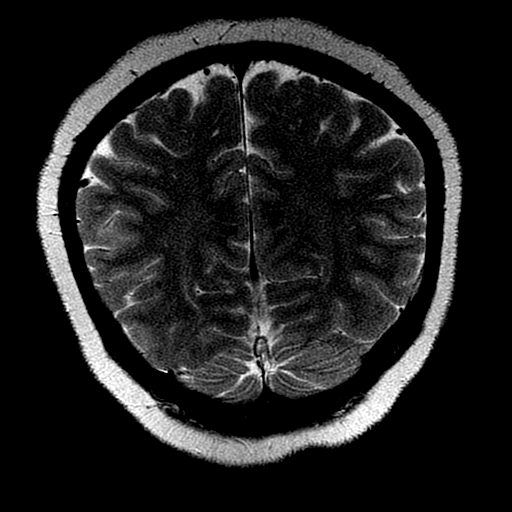

[Series 11: T2 · coronal · 5.0mm · 0.39mm/px · 2 of 26 slices shown (3 of 3)]
[im 1/26]
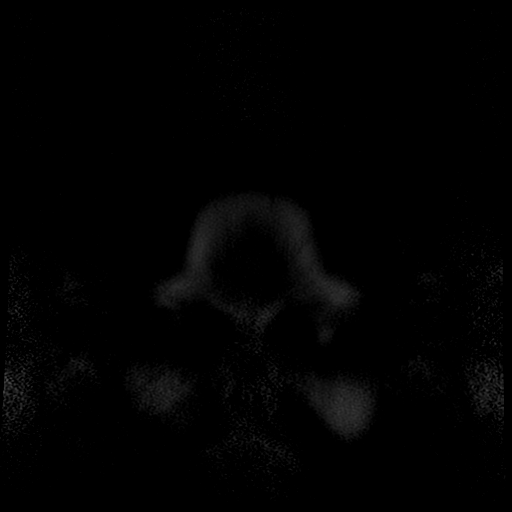
[im 26/26]
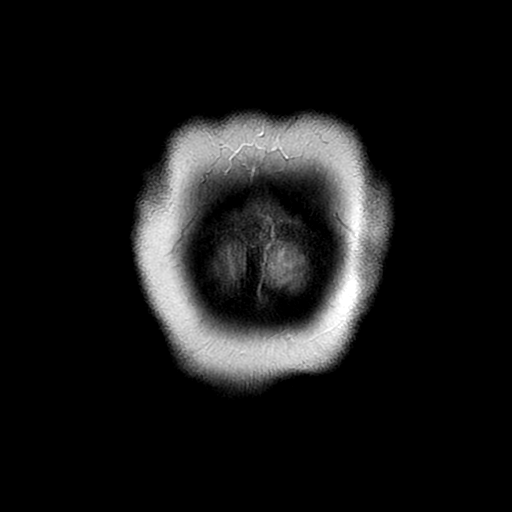

[Series 12: FLAIR · oblique · 3.0mm · 0.39mm/px · 3 of 30 slices shown (2 of 2)]
[im 1/30]
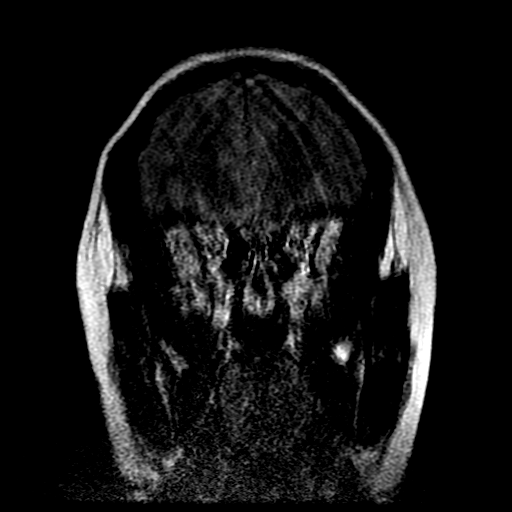
[im 15/30]
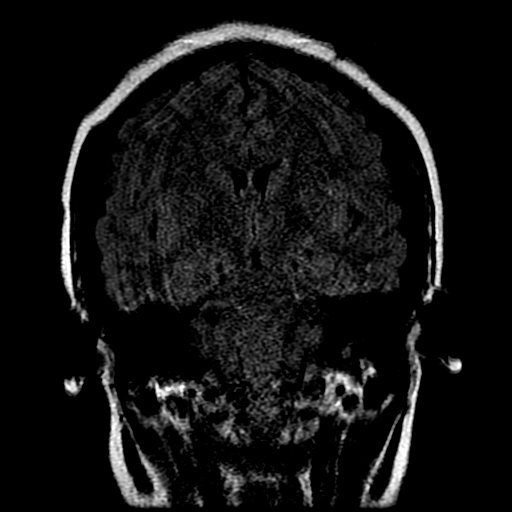
[im 30/30]
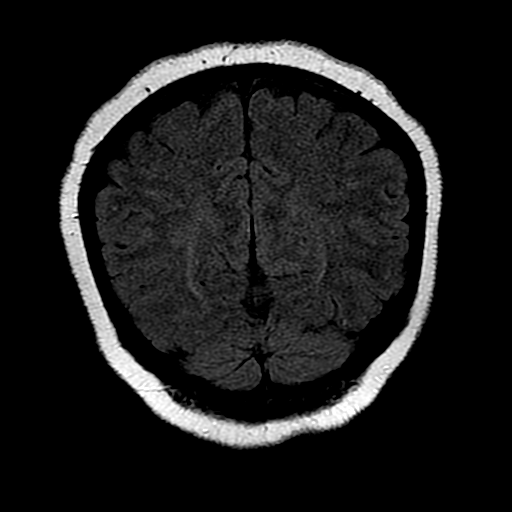

[Series 300: DWI · axial · 3.0mm · 1.09mm/px · z∈[-157,-10]mm · 5 of 53 slices shown (3 of 4)]
[im 1/53]
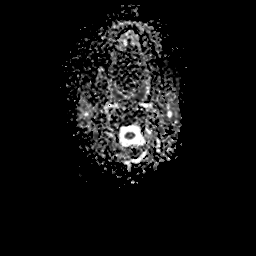
[im 14/53]
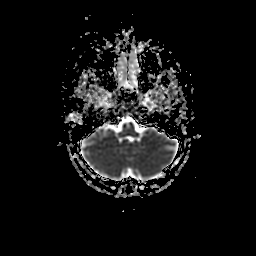
[im 27/53]
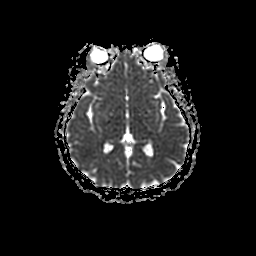
[im 40/53]
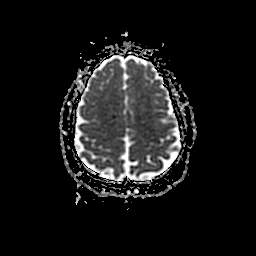
[im 53/53]
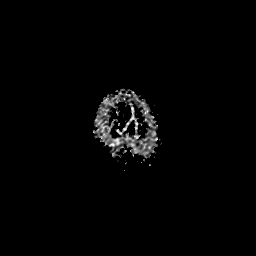

[Series 400: DWI · coronal · 5.0mm · 1.09mm/px · 3 of 34 slices shown (4 of 4)]
[im 1/34]
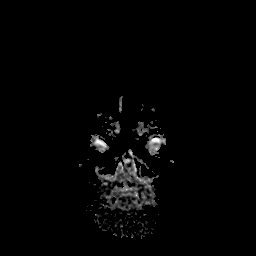
[im 17/34]
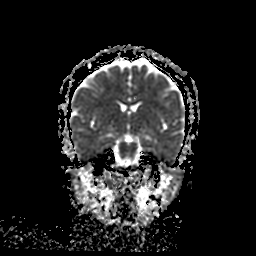
[im 34/34]
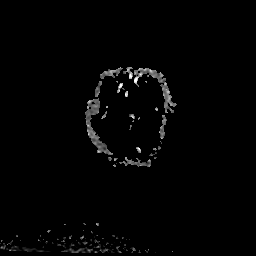

[35 of 48 positions shown; findings below may reference images not displayed]

FINDINGS: Brain: The brain has a normal appearance without evidence of
malformation, atrophy, old or acute small or large vessel
infarction, mass lesion, hemorrhage, hydrocephalus or extra-axial
collection. Mesial temporal lobes appear normal and symmetric.

Vascular: Major vessels at the base of the brain show flow. Venous
sinuses appear patent.

Skull and upper cervical spine: Normal.

Sinuses/Orbits: Clear/normal.

Other: None significant.
IMPRESSION: Normal examination.  No abnormality seen to explain seizure.

## 2022-04-27 MED ORDER — HYDRALAZINE HCL 20 MG/ML IJ SOLN: 5.0000 mg | INTRAMUSCULAR | Status: DC | PRN

## 2022-04-27 MED ORDER — LORAZEPAM 2 MG/ML IJ SOLN: 4.0000 mg | INTRAMUSCULAR | Status: DC | PRN

## 2022-04-27 MED ORDER — ACETAMINOPHEN 325 MG PO TABS: 650.0000 mg | ORAL_TABLET | ORAL | Status: DC | PRN

## 2022-04-27 MED ORDER — SODIUM CHLORIDE 0.9% FLUSH
3.0000 mL | Freq: Two times a day (BID) | INTRAVENOUS | Status: DC
  Administered 2022-04-27 – 2022-04-28 (×2): 3 mL via INTRAVENOUS

## 2022-04-27 MED ORDER — ENOXAPARIN SODIUM 40 MG/0.4ML IJ SOSY
40.0000 mg | PREFILLED_SYRINGE | INTRAMUSCULAR | Status: DC
  Administered 2022-04-27: 40 mg via SUBCUTANEOUS
  Filled 2022-04-27: qty 0.4

## 2022-04-27 MED ORDER — GADOBUTROL 1 MMOL/ML IV SOLN
7.0000 mL | Freq: Once | INTRAVENOUS | Status: AC | PRN
  Administered 2022-04-27: 7 mL via INTRAVENOUS

## 2022-04-27 MED ORDER — ONDANSETRON HCL 4 MG/2ML IJ SOLN: 4.0000 mg | Freq: Four times a day (QID) | INTRAMUSCULAR | Status: DC | PRN

## 2022-04-27 MED ORDER — ACETAMINOPHEN 650 MG RE SUPP: 650.0000 mg | RECTAL | Status: DC | PRN

## 2022-04-27 MED ORDER — ONDANSETRON HCL 4 MG PO TABS: 4.0000 mg | ORAL_TABLET | Freq: Four times a day (QID) | ORAL | Status: DC | PRN

## 2022-04-27 NOTE — ED Notes (Addendum)
Pt transported [facility] for [facility]. Pt presented to [facility] for seizures. Pt has no hx of seizures but had 4 within a 24 hr period.  Pt is being admitted per Dr. Leafy Half.  ? ?

## 2022-04-27 NOTE — H&P (Signed)
?History and Physical  ? ? ?PatientRexene Joseph: Elizabeth Joseph ZOX:[id number] DOB: 11/12/1996 ?DOA: 04/26/2022 ?DOS: the patient was seen and examined on 04/27/2022 ?PCP: Pcp, No  ?Patient coming from: Home - visiting from [place]; NOK: Elizabeth GarnetSister, Sata Aurich, [phone] ? ? ?Chief Complaint: Seizures ? ?HPI: Elizabeth Joseph is a 26 y.o. female with no significant known medical history presenting with seizures.  She began having seizure-like activity this weekend.  She had 4 or so total episodes.  I viewed the video of one of these episodes and she started with GTC activity while sitting up on the couch and eventually collapsed to the floor while seizing with frothing at the mouth.  The episode lasted 2-3 minutes.  Her sister reports that she is unresponsive for about 10 minutes after and slowly returns to normal but is unaware of what happened after.  No FH of seizures or apparent neurologic issues.  No prior h/o seizure d/o.  She has generalized muscle pain today. She is visiting from [place] and would like to move to the [place].  She does not have a PCP or insurance. ? ? ? ?ER Course[facility]  [facility] per Dr. Leafy HalfShalhoub: ? ?Patient has been visiting for several weeks and suddenly on Sunday began to experience seizure activity.  Videos suggestive of tonic-clonic activity.  The patient experienced additional episodes of seizure activity over the next 3 days and finally presented to the ER.  Head CT unremarkable.  Labs negative. Dr. Derry LoryKhaliqdina recommended IV Keppra load -> 500 mg of Keppra BID and MRI of the brain and EEG. ? ? ? ? ?Review of Systems: unable to review all systems due to the inability of the patient to answer questions.  She does not speak [place]. ? ?Past Medical History:  ?Diagnosis Date  ? Seizure (HCC)   ? ?History reviewed. No pertinent surgical history. ?Social History:  reports that she has never smoked. She has never used smokeless tobacco. She reports that she does not drink alcohol and does not use drugs. ? ?No Known  Allergies ? ?Family History  ?Problem Relation Age of Onset  ? Seizures Neg Hx   ? Stroke Neg Hx   ? ? ?Prior to Admission medications   ?Medication Sig Start Date End Date Taking? Authorizing Provider  ?Ascorbic Acid (VITAMIN C PO) Take 1 tablet by mouth daily.   Yes [provider]  ?COLLAGEN PO Take 1 capsule by mouth daily.   Yes [provider]  ?diphenhydramine-acetaminophen (TYLENOL PM) 25-500 MG TABS tablet Take 1 tablet by mouth at bedtime as needed (sleep, headaches).   Yes [provider]  ?FENUGREEK PO Take 1 tablet by mouth daily.   Yes [provider]  ?Omega-3 Fatty Acids (OMEGA 3 PO) Take 1 capsule by mouth daily.   Yes [provider]  ? ? ?Physical Exam: ?Vitals:  ? 04/27/22 0500 04/27/22 0600 04/27/22 0900 04/27/22 1015  ?BP: 115/76 95/70 102/65 108/72  ?Pulse: 72 70 77 71  ?Resp: 16 17 15 16   ?Temp:    98.2 ?F (36.8 ?C)  ?TempSrc:    Oral  ?SpO2: 100% 100% 100% 99%  ?Weight:      ?Height:      ? ?General:  Appears calm and comfortable and is in NAD ?Eyes:  EOMI, normal lids, iris ?ENT:  grossly normal hearing, lips & tongue, mmm ?Neck:  no LAD, masses or thyromegaly ?Cardiovascular:  RRR, no m/r/g. No LE edema.  ?Respiratory:   CTA bilaterally with no wheezes/rales/rhonchi.  Normal respiratory effort. ?Abdomen:  soft, generalized TTP - ? Abdominal wall, ND ?Skin:  no rash or induration seen on limited exam ?Musculoskeletal:  grossly normal tone BUE/BLE, good ROM, no bony abnormality, reported generalized muscle TTP ?Psychiatric:  grossly normal mood and affect, speech fluent and appropriate, AOx3 ?Neurologic:  CN 2-12 grossly intact, moves all extremities in coordinated fashion ? ? ?Radiological Exams on Admission: ?Independently reviewed - see discussion in A/P where applicable ? ?DG Chest 2 View ? ?Result Date: 04/26/2022 ?CLINICAL DATA:  Multiple seizures. EXAM: CHEST - 2 VIEW COMPARISON:  None Available. FINDINGS: The cardiomediastinal contours are  normal. The lungs are clear. Pulmonary vasculature is normal. No consolidation, pleural effusion, or pneumothorax. No acute osseous abnormalities are seen. IMPRESSION: Negative radiographs of the chest. Electronically Signed   By: Narda Rutherford M.D.   On: 04/26/2022 21:53  ? ?CT Head Wo Contrast ? ?Result Date: 04/26/2022 ?CLINICAL DATA:  Seizure. EXAM: CT HEAD WITHOUT CONTRAST TECHNIQUE: Contiguous axial images were obtained from the base of the skull through the vertex without intravenous contrast. RADIATION DOSE REDUCTION: This exam was performed according to the departmental dose-optimization program which includes automated exposure control, adjustment of the mA and/or kV according to patient size and/or use of iterative reconstruction technique. COMPARISON:  None Available. FINDINGS: Brain: The ventricles and sulci are appropriate size for the patient's age. The gray-white matter discrimination is preserved. There is no acute intracranial hemorrhage. No mass effect or midline shift. No extra-axial fluid collection. Vascular: No hyperdense vessel or unexpected calcification. Skull: Normal. Negative for fracture or focal lesion. Sinuses/Orbits: No acute finding. Other: None IMPRESSION: Unremarkable noncontrast CT of the brain. Electronically Signed   By: Elgie Collard M.D.   On: 04/26/2022 21:43   ? ?EKG: Independently reviewed.  NSR with rate 88; no evidence of acute ischemia ? ? ?Labs on Admission: I have personally reviewed the available labs and imaging studies at the time of the admission. ? ?Pertinent labs:   ? ?Normal CMP ?Unremarkable CBC ?Negative upreg ?Normal UA ?UDS negative ? ? ?Assessment and Plan: ?* New onset seizure (HCC) ?-Patient without known medical problems or prior seizure presenting with multiple apparent new-onset seizures ?-Patient admitted to telemetry for further evaluation ?-Neurology consult ?-Loaded with Keppra and then started on 500 mg PO BID ?-Will order EEG and MRI ?-She  also will need driving restriction for at least 6 months ?-Seizure precautions ?-Ativan prn ? ?High risk social situation ?-Patient is visiting from [place] ?-No insurance, no doctor here ?-[facility] team consult for assistance ? ? ? ? ? ? ?Advance Care Planning:   Code Status: Full Code  ? ?Consults: Neurology; [facility] team ? ?DVT Prophylaxis: Lovenox ? ?Family Communication: Her sister was present throughout evaluation and served as Nurse, learning disability ? ?Severity of Illness: ?The appropriate patient status for this patient is INPATIENT. Inpatient status is judged to be reasonable and necessary in order to provide the required intensity of service to ensure the patient's safety. The patient's presenting symptoms, physical exam findings, and initial radiographic and laboratory data in the context of their chronic comorbidities is felt to place them at high risk for further clinical deterioration. Furthermore, it is not anticipated that the patient will be medically stable for discharge from the hospital within 2 midnights of admission.  ? ?* I certify that at the point of admission it is my clinical judgment that the patient will require inpatient hospital care spanning beyond 2 midnights from the point of admission due to  high intensity of service, high risk for further deterioration and high frequency of surveillance required.* ? ?Author: ?Jonah Blue, MD ?04/27/2022 12:49 PM ? ?For on call review [email or web address].  ?

## 2022-04-27 NOTE — Assessment & Plan Note (Deleted)
-  Patient is visiting from [place] ?-No insurance, no doctor here ?-[facility] team consult for assistance ?

## 2022-04-27 NOTE — Progress Notes (Signed)
Plan of Care Note for accepted transfer ? ? ?Patient: Elizabeth Joseph MRN: [id number]   DOA: 04/26/2022 ? ?Facility requesting transfer: [facility] ?Requesting Provider: PA Wyn Quaker ?Reason for transfer: New Onset Seizures ?Facility course:  ? ?26 year old female visiting the area from [place]  (speaks [place]) with no past medical history presenting to [facility] emergency department with new onset seizures. ? ?According to the patient's sister who can speak English patient has been visiting for several weeks and suddenly on Sunday began to experience seizure activity.  The sister has shown videos of the seizure activity to the emergency department provider who identifies it as tonic-clonic activity.  Despite these recurrent fevers they did not seek medical attention initially.  The patient experienced additional episodes of seizure activity over the next 3 days and finally on Wednesday 5/9 the patient eventually presented to [facility] for evaluation. ? ?On evaluation in the emergency department noncontrast CT imaging of the head was unremarkable.  Electrolytes were found to be within normal limits.  Toxicology screen was found to be negative.  Case was discussed with Dr. Lorrin Goodell with neurology who recommended intravenous Keppra load followed by transitioning patient to 500 mg of Keppra twice daily.  He also recommended hospitalization at [facility] for work-up including MRI of the brain and EEG. ? ?Plan of care: ?The patient is accepted for admission to [place]  unit, at [facility]..  ? ? ?Author: ?Vernelle Emerald, MD ?04/27/2022 ? ?Check [email or web address] for on-call coverage. ? ?Nursing staff, Please call [facility] number on Amion as soon as patient's arrival, so appropriate admitting provider can evaluate the pt. ?

## 2022-04-27 NOTE — Consult Note (Signed)
NEUROLOGY CONSULTATION NOTE  ? ?Date of service: Apr 27, 2022 ?Patient Name: Elizabeth Joseph ?MRN:  [id number] ?DOB:  02/07/1996 ?Reason for consult: "seizure" ?_ _ _   _ __   _ __ _ _  __ __   _ __   __ _ ? ?History of Present Illness  ?Elizabeth Joseph is a 26 y.o. female with no known PMH  who presents with  seizure-like activity.  ? ?Patient states that her symptoms initially began this last Saturday.  She had to seizures.  Subsequently she had 2-3 more seizures on Sunday.  She denies having any history of seizure-like activity in the past.  She does endorse feeling anxious and short of breath with proceeding events but then has no recollection of the events during the seizure.  Patient denies any new medications, illicit drugs, recent infections, sleep deprivation leading up to these symptoms.  She denies any focal neurologic deficits, headaches, change in vision, dizziness, nausea, or vomiting preceding these events.  Currently the patient feels generalized fatigue with diffuse muscle soreness.  But otherwise, she denies any significant symptoms. ?  ?ROS  ? ?Constitutional Denies weight loss, fever and chills.   ?HEENT Denies changes in vision and hearing.   ?Respiratory Denies SOB and cough.   ?CV Denies palpitations and CP   ?GI Denies abdominal pain, nausea, vomiting and diarrhea.   ?GU Denies dysuria and urinary frequency.   ?MSK Denies myalgia and joint pain.   ?Skin Denies rash and pruritus.   ?Neurological Denies headache and syncope.   ?Psychiatric Denies recent changes in mood. Denies anxiety and depression.   ? ?Past History  ? ?Past Medical History:  ?Diagnosis Date  ? Seizure (HCC)   ? ?History reviewed. No pertinent surgical history. ?Family History  ?Problem Relation Age of Onset  ? Seizures Neg Hx   ? Stroke Neg Hx   ? ?Social History  ? ?Socioeconomic History  ? Marital status: Single  ?  Spouse name: Not on file  ? Number of children: Not on file  ? Years of education: Not on file  ? Highest education  level: Not on file  ?Occupational History  ? Occupation: unemployed  ?Tobacco Use  ? Smoking status: Never  ? Smokeless tobacco: Never  ?Substance and Sexual Activity  ? Alcohol use: Never  ? Drug use: Never  ? Sexual activity: Not on file  ?Other Topics Concern  ? Not on file  ?Social History Narrative  ? Not on file  ? ?Social Determinants of Health  ? ?Financial Resource Strain: Not on file  ?Food Insecurity: Not on file  ?Transportation Needs: Not on file  ?Physical Activity: Not on file  ?Stress: Not on file  ?Social Connections: Not on file  ? ?No Known Allergies ? ?Medications  ?(Not in a hospital admission) ?  ? ?Vitals  ? ?Vitals:  ? 04/27/22 0500 04/27/22 0600 04/27/22 0900 04/27/22 1015  ?BP: 115/76 95/70 102/65 108/72  ?Pulse: 72 70 77 71  ?Resp: 16 17 15 16   ?Temp:    98.2 ?F (36.8 ?C)  ?TempSrc:    Oral  ?SpO2: 100% 100% 100% 99%  ?Weight:      ?Height:      ?  ? ?Body mass index is 30.23 kg/m?. ? ?Physical Exam  ? ?General: Laying comfortably in bed; in no acute distress.  ?HENT: Normal oropharynx and mucosa. Normal external appearance of ears and nose.  ?Neck: Supple, no pain or tenderness  ?CV: RRR. No  peripheral edema.  ?Pulmonary: Symmetric Chest rise. Normal respiratory effort.  ?Ext: No cyanosis, edema, or deformity  ?Skin: No rash. Normal palpation of skin.   ?Musculoskeletal: Normal digits and nails by inspection. No clubbing.  ? ?Neurologic Examination  ?Mental status/Cognition: Alert, oriented to self, place, month and year, good attention.  ?Speech/language: Fluent, comprehension intact, object naming intact, repetition intact.  ?Cranial nerves:  ? CN II Pupils equal and reactive to light, no VF deficits   ? CN III,IV,VI EOM intact, no gaze preference or deviation, no nystagmus   ? CN V normal sensation in V1, V2, and V3 segments bilaterally   ? CN VII no asymmetry, no nasolabial fold flattening   ? CN VIII normal hearing to speech   ? CN IX & X normal palatal elevation, no uvular deviation    ? CN XI 5/5 head turn and 5/5 shoulder shrug bilaterally   ? CN XII midline tongue protrusion   ? ?Motor:  ?Muscle bulk: normal, tone normal, pronator drift absent, tremor absent ?5/5 muscle strength bilateral upper and lower extremities.  ? ? ?Sensation: ?Grossly normal sensation to light touch bilaterally in upper and lower extremities. ? ?Coordination/Complex Motor:  ?- Finger to Nose normal ? ? ?Labs  ? ?CBC:  ?Recent Labs  ?Lab 04/26/22 ?1956  ?WBC 8.4  ?NEUTROABS 4.7  ?HGB 12.0  ?HCT 36.9  ?MCV 85.6  ?PLT 422*  ? ? ?Basic Metabolic Panel:  ?Lab Results  ?Component Value Date  ? NA 141 04/26/2022  ? K 3.8 04/26/2022  ? CO2 30 04/26/2022  ? GLUCOSE 91 04/26/2022  ? BUN 7 04/26/2022  ? CREATININE 0.71 04/26/2022  ? CALCIUM 9.1 04/26/2022  ? GFRNONAA >60 04/26/2022  ? ?Lipid Panel: No results found for: LDLCALC ?HgbA1c: No results found for: HGBA1C ?Urine Drug Screen:  ?   ?Component Value Date/Time  ? LABOPIA NONE DETECTED 04/26/2022 1956  ? COCAINSCRNUR NONE DETECTED 04/26/2022 1956  ? LABBENZ NONE DETECTED 04/26/2022 1956  ? AMPHETMU NONE DETECTED 04/26/2022 1956  ? THCU NONE DETECTED 04/26/2022 1956  ? LABBARB NONE DETECTED 04/26/2022 1956  ?  ?Alcohol Level No results found for: ETH ? ?CT Head without contrast: ?Unremarkable ? ?MRI Brain without contrast: Unremarkable ? ?MRI Brain with contrast: pending ? ?rEEG: pending ? ?Impression  ? ?Generalized seizures: ?Patient presents with multiple episodes of what appears to be generalized seizures.  Unsure if they were originally focal with secondary generalization.  Unclear etiology.  Patient denies any infectious, toxic or metabolic disturbances.  Additionally, patient has no family history of seizure disorders.  Most recent MRI without contrast was unrevealing however MRI with contrast is pending we will need to get an EEG and start AEDs.  We will likely need to keep the patient overnight to finish work-up for secondary causes of seizures.  Additionally  patient has significant fatigue and diffuse myalgias concerning for rhabdomyolysis.  No evidence of pigment induced nephropathy on most recent CMP.  We will get CK to further evaluate ? ?Recommendations  ?Generalized Seizures: ?-OBs admission for generalized seizures ?-Seizure precautions ?-MRI with contrast pending ?-EEG pending ?-Load with 2 g Keppra start subsequent Keppra 500 twice daily ?-Ativan as needed for seizure-like activity ?-U tox ?-CBC shows mild thrombocytosis with no other cell line abnormalities ?-CMP unrevealing ?-Magnesium within normal limits ?-Pregnancy test negative ?-We will need CK to further evaluate for rhabdomyolysis ?- HIV pending  ?_____________________________________________________________________ ? ? ?Thank you for the opportunity to take part in the care of  this patient. If you have any further questions, please contact the neurology consultation attending. ? ?Signed, ? ?[email or web address] @ ? ?

## 2022-04-27 NOTE — ED Notes (Signed)
Pt transported to MRI 

## 2022-04-27 NOTE — Assessment & Plan Note (Addendum)
No witnessed seizures in patient, however video documentation of episode suggestive of seizure activity. Neurology consulted. MRI and EEG unremarkable. Recommendation for Keppra 500 mg BID (one years supply prescribed). Driving restrictions for at least 6 months, but patient states she does not drive. ?

## 2022-04-27 NOTE — ED Notes (Signed)
Pts sister Joylene John called and updated on pts condition, made aware of transfer pending to ED [facility].  ?

## 2022-04-28 ENCOUNTER — Inpatient Hospital Stay (HOSPITAL_COMMUNITY): Payer: Self-pay

## 2022-04-28 LAB — CBC
HCT: 37.1 % (ref 36.0–46.0)
Hemoglobin: 12.8 g/dL (ref 12.0–15.0)
MCH: 29.1 pg (ref 26.0–34.0)
MCHC: 34.5 g/dL (ref 30.0–36.0)
MCV: 84.3 fL (ref 80.0–100.0)
Platelets: 416 10*3/uL — ABNORMAL HIGH (ref 150–400)
RBC: 4.4 MIL/uL (ref 3.87–5.11)
RDW: 13.4 % (ref 11.5–15.5)
WBC: 7.2 10*3/uL (ref 4.0–10.5)
nRBC: 0 % (ref 0.0–0.2)

## 2022-04-28 LAB — HIV ANTIBODY (ROUTINE TESTING W REFLEX): HIV Screen 4th Generation wRfx: NONREACTIVE

## 2022-04-28 LAB — BASIC METABOLIC PANEL
Anion gap: 9 (ref 5–15)
BUN: 5 mg/dL — ABNORMAL LOW (ref 6–20)
CO2: 24 mmol/L (ref 22–32)
Calcium: 9 mg/dL (ref 8.9–10.3)
Chloride: 105 mmol/L (ref 98–111)
Creatinine, Ser: 0.5 mg/dL (ref 0.44–1.00)
GFR, Estimated: >60 mL/min (ref >60)
Glucose, Bld: 90 mg/dL (ref 70–99)
Potassium: 3.8 mmol/L (ref 3.5–5.1)
Sodium: 138 mmol/L (ref 135–145)

## 2022-04-28 MED ORDER — LEVETIRACETAM 500 MG PO TABS
500.0000 mg | ORAL_TABLET | Freq: Two times a day (BID) | ORAL | 3 refills | Status: AC
Start: 2022-04-28 — End: 2023-04-23

## 2022-04-28 MED ORDER — LEVETIRACETAM 500 MG PO TABS: 500.0000 mg | ORAL_TABLET | Freq: Two times a day (BID) | ORAL | 0 refills | Status: DC

## 2022-04-28 NOTE — Progress Notes (Signed)
EEG completed, results pending. 

## 2022-04-28 NOTE — Discharge Instructions (Signed)
Elizabeth Joseph, ? ?You were in the hospital with seizures. You have been started on Keppra. Please do not drive for at least 6 months. ?

## 2022-04-28 NOTE — Procedures (Signed)
Patient Name: Elizabeth Joseph  ?MRN: [id number]  ?Epilepsy Attending: Lora Havens  ?Referring Physician/Provider: Karmen Bongo, MD ?Date: 04/28/2022 ?Duration: 23.21 mins ? ?Patient history: 26yo F patient presents with multiple episodes of what appears to be generalized seizures.  EEG to evaluate for seizure. ? ?Level of alertness: Awake, asleep ? ?AEDs during EEG study: LEV ? ?Technical aspects: This EEG study was done with scalp electrodes positioned according to the 10-20 International system of electrode placement. Electrical activity was acquired at a sampling rate of 500Hz  and reviewed with a high frequency filter of 70Hz  and a low frequency filter of 1Hz . EEG data were recorded continuously and digitally stored.  ? ?Description: The posterior dominant rhythm consists of 8 Hz activity of moderate voltage (25-35 uV) seen predominantly in posterior head regions, symmetric and reactive to eye opening and eye closing. Sleep was characterized by vertex waves, sleep spindles (12 to 14 Hz), maximal frontocentral region. Physiologic photic driving was seen during photic stimulation.  Hyperventilation was not performed.    ? ?IMPRESSION: ?This study is within normal limits. No seizures or epileptiform discharges were seen throughout the recording. ? ?Lora Havens  ? ?

## 2022-04-28 NOTE — TOC Transition Note (Signed)
Transition of Care (TOC) - CM/SW Discharge Note ? ? ?Patient Details  ?Name: Loana Lippard ?MRN: [id number] ?Date of Birth: [birth year] ? ?Transition of Care (TOC) CM/SW Contact:  ?Kermit Balo, RN ?Phone Number: ?04/28/2022, 1:46 PM ? ? ?Clinical Narrative:    ?Patient discharging home with self care. Has PCP appointment on [facility].  ?Keppra is $9 through [facility].  ?Pt has transportation home.  ? ? ?Final next level of care: Home/Self Care ?Barriers to Discharge: Inadequate or no insurance, Barriers Unresolved (comment) ? ? ?Patient Goals and CMS Choice ?  ?  ?  ? ?Discharge Placement ?  ?           ?  ?  ?  ?  ? ?Discharge Plan and Services ?  ?  ?           ?  ?  ?  ?  ?  ?  ?  ?  ?  ?  ? ?Social Determinants of Health (SDOH) Interventions ?  ? ? ?Readmission Risk Interventions ?   ? View : No data to display.  ?  ?  ?  ? ? ? ? ? ?

## 2022-04-28 NOTE — Plan of Care (Signed)
°  Problem: Education: °Goal: Expressions of having a comfortable level of knowledge regarding the disease process will increase °Outcome: Adequate for Discharge °  °Problem: Coping: °Goal: Ability to adjust to condition or change in health will improve °Outcome: Adequate for Discharge °Goal: Ability to identify appropriate support needs will improve °Outcome: Adequate for Discharge °  °Problem: Health Behavior/Discharge Planning: °Goal: Compliance with prescribed medication regimen will improve °Outcome: Adequate for Discharge °  °Problem: Medication: °Goal: Risk for medication side effects will decrease °Outcome: Adequate for Discharge °  °Problem: Clinical Measurements: °Goal: Complications related to the disease process, condition or treatment will be avoided or minimized °Outcome: Adequate for Discharge °Goal: Diagnostic test results will improve °Outcome: Adequate for Discharge °  °Problem: Safety: °Goal: Verbalization of understanding the information provided will improve °Outcome: Adequate for Discharge °  °Problem: Self-Concept: °Goal: Level of anxiety will decrease °Outcome: Adequate for Discharge °Goal: Ability to verbalize feelings about condition will improve °Outcome: Adequate for Discharge °  °

## 2022-04-28 NOTE — Plan of Care (Signed)
?  Problem: Education: ?Goal: Expressions of having a comfortable level of knowledge regarding the disease process will increase ?04/28/2022 0140 by Narda Rutherford, RN ?Outcome: Progressing ?04/28/2022 0059 by Narda Rutherford, RN ?Outcome: Progressing ?  ?Problem: Coping: ?Goal: Ability to adjust to condition or change in health will improve ?04/28/2022 0140 by Narda Rutherford, RN ?Outcome: Progressing ?04/28/2022 0059 by Narda Rutherford, RN ?Outcome: Progressing ?Goal: Ability to identify appropriate support needs will improve ?04/28/2022 0140 by Narda Rutherford, RN ?Outcome: Progressing ?04/28/2022 0059 by Narda Rutherford, RN ?Outcome: Progressing ?  ?Problem: Health Behavior/Discharge Planning: ?Goal: Compliance with prescribed medication regimen will improve ?04/28/2022 0140 by Narda Rutherford, RN ?Outcome: Progressing ?04/28/2022 0059 by Narda Rutherford, RN ?Outcome: Progressing ?  ?Problem: Medication: ?Goal: Risk for medication side effects will decrease ?04/28/2022 0140 by Narda Rutherford, RN ?Outcome: Progressing ?04/28/2022 0059 by Narda Rutherford, RN ?Outcome: Progressing ?  ?Problem: Clinical Measurements: ?Goal: Complications related to the disease process, condition or treatment will be avoided or minimized ?04/28/2022 0140 by Narda Rutherford, RN ?Outcome: Progressing ?04/28/2022 0059 by Narda Rutherford, RN ?Outcome: Progressing ?Goal: Diagnostic test results will improve ?04/28/2022 0140 by Narda Rutherford, RN ?Outcome: Progressing ?04/28/2022 0059 by Narda Rutherford, RN ?Outcome: Progressing ?  ?Problem: Safety: ?Goal: Verbalization of understanding the information provided will improve ?04/28/2022 0140 by Narda Rutherford, RN ?Outcome: Progressing ?04/28/2022 0059 by Narda Rutherford, RN ?Outcome: Progressing ?  ?Problem: Self-Concept: ?Goal: Level of anxiety will decrease ?04/28/2022 0140 by Narda Rutherford, RN ?Outcome: Progressing ?04/28/2022 0059 by Narda Rutherford, RN ?Outcome: Progressing ?Goal: Ability to verbalize feelings about  condition will improve ?04/28/2022 0140 by Narda Rutherford, RN ?Outcome: Progressing ?04/28/2022 0059 by Narda Rutherford, RN ?Outcome: Progressing ?  ?

## 2022-04-28 NOTE — Discharge Summary (Signed)
?Physician Discharge Summary ?  ?Patient: Elizabeth Joseph MRN: [id number] DOB: [birth year]  ?Admit date:     04/26/2022  ?Discharge date: 25/11/23  ?Discharge Physician: Jacquelin Hawking, MD  ? ?PCP: Pcp, No  ? ?Recommendations at discharge:  ?PCP follow-up ?Neurology follow-up ?No driving for at least 6 months (confirmed patient understands) ? ?Discharge Diagnoses: ?Principal Problem: ?  New onset seizure (HCC) ? ?Resolved Problems: ?  * No resolved hospital problems. * ? ?Assessment and Plan: ?* New onset seizure (HCC) ?No witnessed seizures in patient, however video documentation of episode suggestive of seizure activity. Neurology consulted. MRI and EEG unremarkable. Recommendation for Keppra 500 mg BID (one years supply prescribed). Driving restrictions for at least 6 months, but patient states she does not drive. ? ? ?Consultants: Neurology ?Procedures performed: EEG  ?Disposition: Home ?Diet recommendation:  ?Regular diet ?DISCHARGE MEDICATION: ?Allergies as of 04/28/2022   ?No Known Allergies ?  ? ?  ?Medication List  ?  ? ?TAKE these medications   ? ?COLLAGEN PO ?Take 1 capsule by mouth daily. ?  ?diphenhydramine-acetaminophen 25-500 MG Tabs tablet ?Commonly known as: TYLENOL PM ?Take 1 tablet by mouth at bedtime as needed (sleep, headaches). ?  ?FENUGREEK PO ?Take 1 tablet by mouth daily. ?  ?levETIRAcetam 500 MG tablet ?Commonly known as: KEPPRA ?Take 1 tablet (500 mg total) by mouth 2 (two) times daily. ?  ?OMEGA 3 PO ?Take 1 capsule by mouth daily. ?  ?VITAMIN C PO ?Take 1 tablet by mouth daily. ?  ? ?  ? ? Follow-up Information   ? ? [facility]. Go on 25/19/2023.   ?Why: at 10:00 am with Dr. Sande Brothers ?Contact information: ?[address] ?[place] [address] ?[phone] ? ?  ?  ? ? [facility]. Schedule an appointment as soon as possible for a visit in 4 week(s).   ?Why: Seizures ?Contact information: ?[address] [place], [address] ?[place]  [address] ?[phone] ? ?  ?  ? ?  ?  ? ?  ? ?Discharge Exam: ?BP (!) 111/57 (BP Location: Right Arm)   Pulse 90   Temp 98.3 ?F (36.8 ?C) (Oral)   Resp 16   Ht 5\' 1"  (1.549 m)   Wt 72.6 kg   SpO2 98%   BMI 30.23 kg/m?  ? ?General exam: Appears calm and comfortable ?Respiratory system: Clear to auscultation. Respiratory effort normal. ?Cardiovascular system: S1 & S2 heard, RRR. No murmurs, rubs, gallops or clicks. ?Gastrointestinal system: Abdomen is nondistended, soft and nontender. Normal bowel sounds heard. ?Central nervous system: Alert and oriented. No focal neurological deficits. ?Musculoskeletal: No edema. No calf tenderness ?Skin: No cyanosis. No rashes ?Psychiatry: Judgement and insight appear normal. Mood & affect appropriate.  ? ?Condition at discharge: stable ? ?The results of significant diagnostics from this hospitalization (including imaging, microbiology, ancillary and laboratory) are listed below for reference.  ? ?Imaging Studies: ?DG Chest 2 View ? ?Result Date: 04/26/2022 ?CLINICAL DATA:  Multiple seizures. EXAM: CHEST - 2 VIEW COMPARISON:  None Available. FINDINGS: The cardiomediastinal contours are normal. The lungs are clear. Pulmonary vasculature is normal. No consolidation, pleural effusion, or pneumothorax. No acute osseous abnormalities are seen. IMPRESSION: Negative radiographs of the chest. Electronically Signed   By: 06/26/2022 M.D.   On: 04/26/2022 21:53  ? ?CT Head Wo Contrast ? ?Result Date: 04/26/2022 ?CLINICAL DATA:  Seizure. EXAM: CT HEAD WITHOUT CONTRAST TECHNIQUE: Contiguous axial images were obtained from the base of the skull  through the vertex without intravenous contrast. RADIATION DOSE REDUCTION: This exam was performed according to the departmental dose-optimization program which includes automated exposure control, adjustment of the mA and/or kV according to patient size and/or use of iterative reconstruction technique. COMPARISON:  None Available. FINDINGS: Brain:  The ventricles and sulci are appropriate size for the patient's age. The gray-white matter discrimination is preserved. There is no acute intracranial hemorrhage. No mass effect or midline shift. No extra-axial fluid collection. Vascular: No hyperdense vessel or unexpected calcification. Skull: Normal. Negative for fracture or focal lesion. Sinuses/Orbits: No acute finding. Other: None IMPRESSION: Unremarkable noncontrast CT of the brain. Electronically Signed   By: Elgie Collard M.D.   On: 04/26/2022 21:43  ? ?MR BRAIN WO CONTRAST ? ?Result Date: 04/27/2022 ?CLINICAL DATA:  Seizure, new onset, no history of trauma. EXAM: MRI HEAD WITHOUT CONTRAST TECHNIQUE: Multiplanar, multiecho pulse sequences of the brain and surrounding structures were obtained without intravenous contrast. COMPARISON:  Head CT yesterday. FINDINGS: Brain: The brain has a normal appearance without evidence of malformation, atrophy, old or acute small or large vessel infarction, mass lesion, hemorrhage, hydrocephalus or extra-axial collection. Mesial temporal lobes appear normal and symmetric. Vascular: Major vessels at the base of the brain show flow. Venous sinuses appear patent. Skull and upper cervical spine: Normal. Sinuses/Orbits: Clear/normal. Other: None significant. IMPRESSION: Normal examination.  No abnormality seen to explain seizure. Electronically Signed   By: Paulina Fusi M.D.   On: 04/27/2022 14:08  ? ?MR BRAIN W CONTRAST ? ?Result Date: 04/27/2022 ?CLINICAL DATA:  Provided history: Generalized seizure. EXAM: MRI HEAD WITH CONTRAST TECHNIQUE: Multiplanar, multiecho pulse sequences of the brain and surrounding structures were obtained with intravenous contrast. CONTRAST:  54mL GADAVIST GADOBUTROL 1 MMOL/ML IV SOLN COMPARISON:  Brain MRI performed earlier today 04/27/2022. FINDINGS: Brain: A contrast-enhanced brain MRI was performed as a follow-up to the non-contrast brain MRI performed earlier today. The following sequences were  acquired for the current study: Axial 3D T1 weighted pre-contrast, axial T1 weighted post-contrast, coronal T1 weighted post-contrast. No pathologic intracranial enhancement is identified. Vascular: Enhancement of the proximal large arterial vessels and dural venous sinuses. Skull and upper cervical spine: No focal suspicious marrow lesion. Sinuses/Orbits: No mass or acute finding within the imaged orbits. Trace mucosal thickening within the bilateral maxillary sinuses. IMPRESSION: No pathologic intracranial enhancement is identified. Electronically Signed   By: Jackey Loge D.O.   On: 04/27/2022 16:31  ? ?EEG adult ? ?Result Date: 04/28/2022 ?Charlsie Quest, MD     04/28/2022 11:26 AM Patient Name: Rozell Kettlewell MRN: [id number] Epilepsy Attending: Charlsie Quest Referring Physician/Provider: Jonah Blue, MD Date: 04/28/2022 Duration: 23.21 mins Patient history: 26yo F patient presents with multiple episodes of what appears to be generalized seizures.  EEG to evaluate for seizure. Level of alertness: Awake, asleep AEDs during EEG study: LEV Technical aspects: This EEG study was done with scalp electrodes positioned according to the 10-20 International system of electrode placement. Electrical activity was acquired at a sampling rate of 500Hz  and reviewed with a high frequency filter of 70Hz  and a low frequency filter of 1Hz . EEG data were recorded continuously and digitally stored. Description: The posterior dominant rhythm consists of 8 Hz activity of moderate voltage (25-35 uV) seen predominantly in posterior head regions, symmetric and reactive to eye opening and eye closing. Sleep was characterized by vertex waves, sleep spindles (12 to 14 Hz), maximal frontocentral region. Physiologic photic driving was seen during photic stimulation.  Hyperventilation was not  performed.   IMPRESSION: This study is within normal limits. No seizures or epileptiform discharges were seen throughout the recording. Priyanka Annabelle Harman  Yadav   ? ?Microbiology: ?No results found for this or any previous visit. ? ?Labs: ?CBC: ?Recent Labs  ?Lab 04/26/22 ?1956 04/28/22 ?0312  ?WBC 8.4 7.2  ?NEUTROABS 4.7  --   ?HGB 12.0 12.8  ?HCT 36.9 37.1  ?MCV 85.6 8

## 2022-05-06 ENCOUNTER — Ambulatory Visit: Payer: Self-pay | Admitting: Internal Medicine
